# Patient Record
Sex: Male | Born: 1965 | Race: Black or African American | Hispanic: No | Marital: Single | Smoking: Former smoker
Health system: Southern US, Community
[De-identification: ages and names within clinical notes are randomized; demographics above are authoritative.]

## PROBLEM LIST (undated history)

## (undated) DIAGNOSIS — E1142 Type 2 diabetes mellitus with diabetic polyneuropathy: Secondary | ICD-10-CM

## (undated) DIAGNOSIS — F1010 Alcohol abuse, uncomplicated: Secondary | ICD-10-CM

## (undated) DIAGNOSIS — I10 Essential (primary) hypertension: Secondary | ICD-10-CM

## (undated) DIAGNOSIS — E119 Type 2 diabetes mellitus without complications: Secondary | ICD-10-CM

## (undated) DIAGNOSIS — E669 Obesity, unspecified: Secondary | ICD-10-CM

## (undated) DIAGNOSIS — G4700 Insomnia, unspecified: Secondary | ICD-10-CM

## (undated) DIAGNOSIS — F32A Depression, unspecified: Secondary | ICD-10-CM

## (undated) DIAGNOSIS — D649 Anemia, unspecified: Secondary | ICD-10-CM

## (undated) DIAGNOSIS — R42 Dizziness and giddiness: Secondary | ICD-10-CM

## (undated) DIAGNOSIS — E559 Vitamin D deficiency, unspecified: Secondary | ICD-10-CM

## (undated) DIAGNOSIS — Z87891 Personal history of nicotine dependence: Secondary | ICD-10-CM

## (undated) DIAGNOSIS — E785 Hyperlipidemia, unspecified: Secondary | ICD-10-CM

## (undated) DIAGNOSIS — R7303 Prediabetes: Secondary | ICD-10-CM

## (undated) DIAGNOSIS — G4733 Obstructive sleep apnea (adult) (pediatric): Secondary | ICD-10-CM

## (undated) DIAGNOSIS — F419 Anxiety disorder, unspecified: Secondary | ICD-10-CM

## (undated) DIAGNOSIS — R079 Chest pain, unspecified: Secondary | ICD-10-CM

## (undated) HISTORY — DX: Vitamin D deficiency, unspecified: E55.9

## (undated) HISTORY — DX: Personal history of nicotine dependence: Z87.891

## (undated) HISTORY — DX: Depression, unspecified: F32.A

## (undated) HISTORY — DX: Hyperlipidemia, unspecified: E78.5

## (undated) HISTORY — DX: Chest pain, unspecified: R07.9

## (undated) HISTORY — DX: Type 2 diabetes mellitus with diabetic polyneuropathy: E11.42

## (undated) HISTORY — DX: Anemia, unspecified: D64.9

## (undated) HISTORY — DX: Prediabetes: R73.03

## (undated) HISTORY — DX: Type 2 diabetes mellitus without complications: E11.9

## (undated) HISTORY — PX: NO PAST SURGERIES: SHX2092

## (undated) HISTORY — DX: Obstructive sleep apnea (adult) (pediatric): G47.33

---

## 2005-10-03 ENCOUNTER — Ambulatory Visit: Payer: Self-pay | Admitting: Oncology

## 2006-10-17 ENCOUNTER — Emergency Department (HOSPITAL_COMMUNITY): Admission: EM | Admit: 2006-10-17 | Discharge: 2006-10-17 | Payer: Self-pay | Admitting: Emergency Medicine

## 2010-02-24 ENCOUNTER — Emergency Department (HOSPITAL_COMMUNITY)
Admission: EM | Admit: 2010-02-24 | Discharge: 2010-02-24 | Payer: Self-pay | Source: Home / Self Care | Admitting: Emergency Medicine

## 2010-02-27 LAB — OCCULT BLOOD, POC DEVICE: Fecal Occult Bld: NEGATIVE

## 2011-12-26 ENCOUNTER — Ambulatory Visit: Payer: Self-pay | Admitting: Family Medicine

## 2011-12-26 VITALS — BP 134/84 | HR 87 | Temp 98.4°F | Resp 18 | Ht 73.0 in | Wt 340.6 lb

## 2011-12-26 DIAGNOSIS — M436 Torticollis: Secondary | ICD-10-CM

## 2011-12-26 DIAGNOSIS — G243 Spasmodic torticollis: Secondary | ICD-10-CM

## 2011-12-26 NOTE — Progress Notes (Signed)
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 He woke up yesterday morning with stiff neck and it's gotten worse. He has no pain in his arm no numbness in his arm but the pain is off to the right side of his occipital area and proximal (superior cervical spine). He's never had this problem before.  Objective: Patient sitting calmly in the chair but refusing to move his neck. He has full arm range of motion and sensation is normal as well. Reflexes are normal. He has no pain when flexing or extending his neck but does have pain rotating either right or left greater than 10.  Assessment: Torticollis  Plan: Prednisone 20 mg 2 daily for 5 days and Flexeril 10 mg 3 times a day. He was cautioned about sedation with Flexeril although he has taken before without experiencing the sedation.  Patient was given note for work for today and tomorrow

## 2012-01-01 ENCOUNTER — Emergency Department (INDEPENDENT_AMBULATORY_CARE_PROVIDER_SITE_OTHER)
Admission: EM | Admit: 2012-01-01 | Discharge: 2012-01-01 | Disposition: A | Discharge status: Home / Self Care | Payer: Worker's Compensation | Source: Home / Self Care | Attending: Family Medicine | Admitting: Family Medicine

## 2012-01-01 ENCOUNTER — Encounter (HOSPITAL_COMMUNITY): Payer: Self-pay | Admitting: *Deleted

## 2012-01-01 DIAGNOSIS — S0086XA Insect bite (nonvenomous) of other part of head, initial encounter: Secondary | ICD-10-CM

## 2012-01-01 DIAGNOSIS — S1096XA Insect bite of unspecified part of neck, initial encounter: Secondary | ICD-10-CM

## 2012-01-01 MED ORDER — MUPIROCIN CALCIUM 2 % EX CREA: TOPICAL_CREAM | Freq: Three times a day (TID) | CUTANEOUS | Status: DC

## 2012-01-01 MED ORDER — DOXYCYCLINE HYCLATE 100 MG PO CAPS: 100.0000 mg | ORAL_CAPSULE | Freq: Two times a day (BID) | ORAL | Status: DC

## 2012-01-01 NOTE — ED Notes (Signed)
Pt noticed a spider on his face yesterday.  Today he noticed redness, swelling and pain on his chin

## 2012-01-01 NOTE — ED Provider Notes (Signed)
History     CSN: [id number]  Arrival date & time 01/01/12  1959   First MD Initiated Contact with Patient 01/01/12 2012      Chief Complaint  Patient presents with  . Insect Bite    (Consider location/radiation/quality/duration/timing/severity/associated sxs/prior treatment) Patient is a 46 y.o. male presenting with rash. The history is provided by the patient.  Rash  This is a new problem. The current episode started yesterday. The problem has not changed since onset.The problem is associated with an insect bite/sting (swatted insect off of chin last eve after cobweb contact near dumpster last eve on job.). There has been no fever. The rash is present on the face. The pain is mild. Associated symptoms include blisters.    History reviewed. No pertinent past medical history.  History reviewed. No pertinent past surgical history.  History reviewed. No pertinent family history.  History  Substance Use Topics  . Smoking status: Current Every Day Smoker -- 1.0 packs/day    Types: Cigarettes  . Smokeless tobacco: Not on file  . Alcohol Use: Yes      Review of Systems  Constitutional: Negative.   Skin: Positive for rash and wound.    Allergies  Review of patient's allergies indicates no known allergies.  Home Medications   Current Outpatient Rx  Name  Route  Sig  Dispense  Refill  . IBUPROFEN 200 MG PO TABS   Oral   Take 200 mg by mouth every 6 (six) hours as needed.         Marland Kitchen DOXYCYCLINE HYCLATE 100 MG PO CAPS   Oral   Take 1 capsule (100 mg total) by mouth 2 (two) times daily.   20 capsule   0   . MUPIROCIN CALCIUM 2 % EX CREA   Topical   Apply topically 3 (three) times daily.   30 g   0     BP 120/82  Pulse 100  Temp 98.3 F (36.8 C) (Oral)  Resp 20  SpO2 97%  Physical Exam  Nursing note and vitals reviewed. Constitutional: He is oriented to person, place, and time. He appears well-developed and well-nourished.  Neck: Normal range of motion.  Neck supple.  Lymphadenopathy:    He has cervical adenopathy.  Neurological: He is alert and oriented to person, place, and time.  Skin: Skin is warm and dry. Rash noted. There is erythema.       Local sts with open blister centrally on chin with submandibular node, tender.    ED Course  Procedures (including critical care time)  Labs Reviewed - No data to display No results found.   1. Insect bite of face       MDM          Linna Hoff, MD 01/01/12 2130

## 2013-10-24 ENCOUNTER — Emergency Department (HOSPITAL_COMMUNITY): Payer: BC Managed Care – PPO

## 2013-10-24 ENCOUNTER — Encounter (HOSPITAL_COMMUNITY): Payer: Self-pay | Admitting: Emergency Medicine

## 2013-10-24 ENCOUNTER — Emergency Department (HOSPITAL_COMMUNITY)
Admission: EM | Admit: 2013-10-24 | Discharge: 2013-10-24 | Disposition: A | Discharge status: Home / Self Care | Payer: BC Managed Care – PPO | Attending: Emergency Medicine | Admitting: Emergency Medicine

## 2013-10-24 DIAGNOSIS — F17200 Nicotine dependence, unspecified, uncomplicated: Secondary | ICD-10-CM | POA: Diagnosis not present

## 2013-10-24 DIAGNOSIS — J029 Acute pharyngitis, unspecified: Secondary | ICD-10-CM | POA: Diagnosis present

## 2013-10-24 DIAGNOSIS — J039 Acute tonsillitis, unspecified: Secondary | ICD-10-CM | POA: Insufficient documentation

## 2013-10-24 LAB — CBC WITH DIFFERENTIAL/PLATELET
Basophils Absolute: 0 10*3/uL (ref 0.0–0.1)
Basophils Relative: 0 % (ref 0–1)
Eosinophils Absolute: 0.5 10*3/uL (ref 0.0–0.7)
Eosinophils Relative: 3 % (ref 0–5)
HCT: 38.6 % — ABNORMAL LOW (ref 39.0–52.0)
Hemoglobin: 13.1 g/dL (ref 13.0–17.0)
Lymphocytes Relative: 10 % — ABNORMAL LOW (ref 12–46)
Lymphs Abs: 2 10*3/uL (ref 0.7–4.0)
MCH: 31.9 pg (ref 26.0–34.0)
MCHC: 33.9 g/dL (ref 30.0–36.0)
MCV: 93.9 fL (ref 78.0–100.0)
Monocytes Absolute: 1.9 10*3/uL — ABNORMAL HIGH (ref 0.1–1.0)
Monocytes Relative: 10 % (ref 3–12)
Neutro Abs: 15.1 10*3/uL — ABNORMAL HIGH (ref 1.7–7.7)
Neutrophils Relative %: 77 % (ref 43–77)
Platelets: 283 10*3/uL (ref 150–400)
RBC: 4.11 MIL/uL — ABNORMAL LOW (ref 4.22–5.81)
RDW: 15.3 % (ref 11.5–15.5)
WBC: 19.5 10*3/uL — ABNORMAL HIGH (ref 4.0–10.5)

## 2013-10-24 LAB — BASIC METABOLIC PANEL
Anion gap: 12 (ref 5–15)
BUN: 11 mg/dL (ref 6–23)
CO2: 24 mEq/L (ref 19–32)
Calcium: 9 mg/dL (ref 8.4–10.5)
Chloride: 106 mEq/L (ref 96–112)
Creatinine, Ser: 0.94 mg/dL (ref 0.50–1.35)
GFR calc Af Amer: >90 mL/min (ref >90)
GFR calc non Af Amer: >90 mL/min (ref >90)
Glucose, Bld: 106 mg/dL — ABNORMAL HIGH (ref 70–99)
Potassium: 4 mEq/L (ref 3.7–5.3)
Sodium: 142 mEq/L (ref 137–147)

## 2013-10-24 LAB — RAPID STREP SCREEN (MED CTR MEBANE ONLY): Streptococcus, Group A Screen (Direct): NEGATIVE

## 2013-10-24 MED ORDER — METHYLPREDNISOLONE SODIUM SUCC 125 MG IJ SOLR
125.0000 mg | Freq: Once | INTRAMUSCULAR | Status: AC
  Administered 2013-10-24: 125 mg via INTRAVENOUS
  Filled 2013-10-24: qty 2

## 2013-10-24 MED ORDER — PREDNISONE 10 MG PO TABS: 20.0000 mg | ORAL_TABLET | Freq: Every day | ORAL | Status: DC

## 2013-10-24 MED ORDER — OXYCODONE-ACETAMINOPHEN 5-325 MG PO TABS: 1.0000 | ORAL_TABLET | ORAL | Status: DC | PRN

## 2013-10-24 MED ORDER — MORPHINE SULFATE 4 MG/ML IJ SOLN
4.0000 mg | Freq: Once | INTRAMUSCULAR | Status: AC
  Administered 2013-10-24: 4 mg via INTRAVENOUS
  Filled 2013-10-24: qty 1

## 2013-10-24 MED ORDER — AMOXICILLIN 500 MG PO CAPS: 1000.0000 mg | ORAL_CAPSULE | Freq: Two times a day (BID) | ORAL | Status: DC

## 2013-10-24 MED ORDER — SODIUM CHLORIDE 0.9 % IV SOLN: 1000.0000 mL | INTRAVENOUS | Status: DC

## 2013-10-24 MED ORDER — DEXTROSE 5 % IV SOLN
1.0000 g | Freq: Once | INTRAVENOUS | Status: AC
  Administered 2013-10-24: 1 g via INTRAVENOUS
  Filled 2013-10-24: qty 10

## 2013-10-24 MED ORDER — IOHEXOL 300 MG/ML  SOLN
75.0000 mL | Freq: Once | INTRAMUSCULAR | Status: AC | PRN
  Administered 2013-10-24: 75 mL via INTRAVENOUS

## 2013-10-24 MED ORDER — SODIUM CHLORIDE 0.9 % IV SOLN
1000.0000 mL | Freq: Once | INTRAVENOUS | Status: AC
  Administered 2013-10-24: 1000 mL via INTRAVENOUS

## 2013-10-24 NOTE — ED Notes (Signed)
Patient here with complaint of sore throat, cough, nasal congestion, and mild headache. States that symptoms began 2-3 days ago and have gotten worse. Explains that at this time it is difficult for him to swallow, and it is so uncomfortable he has not been able to sleep. States last sleep was over 24 hours ago.

## 2013-10-24 NOTE — ED Provider Notes (Signed)
CSN: [id number]     Arrival date & time 10/24/13  0450 History   First MD Initiated Contact with Patient 10/24/13 0540     Chief Complaint  Patient presents with  . Nasal Congestion  . Sore Throat  . Cough     (Consider location/radiation/quality/duration/timing/severity/associated sxs/prior Treatment) Patient is a 48 y.o. male presenting with pharyngitis and cough. The history is provided by the patient.  Sore Throat  Cough He started having a sore throat yesterday and has been getting progressively worse. Since last night, has been unable to swallow it is noted that his voice is hoarse. He had subjective fever at home. He had some chills but no sweats. He rates his pain at 8/10. Nothing makes it better. He denies chest discomfort, nausea, vomiting, myalgias or arthralgias. He denies any sick contacts.  History reviewed. No pertinent past medical history. History reviewed. No pertinent past surgical history. History reviewed. No pertinent family history. History  Substance Use Topics  . Smoking status: Current Every Day Smoker -- 0.50 packs/day    Types: Cigarettes  . Smokeless tobacco: Not on file  . Alcohol Use: Yes    Review of Systems  Respiratory: Positive for cough.   All other systems reviewed and are negative.     Allergies  Review of patient's allergies indicates no known allergies.  Home Medications   Prior to Admission medications   Medication Sig Start Date End Date Taking? Authorizing Provider  ibuprofen (ADVIL,MOTRIN) 200 MG tablet Take 800 mg by mouth every 8 (eight) hours as needed for moderate pain.   Yes Historical Provider, MD   BP 143/80  Pulse 110  Temp(Src) 98.8 F (37.1 C) (Oral)  Resp 20  Ht 6\' 2"  (1.88 m)  Wt 340 lb (154.223 kg)  BMI 43.63 kg/m2  SpO2 97% Physical Exam  Nursing note and vitals reviewed.  Morbidly obese 48 year old male, resting comfortably and in no acute distress. Vital signs are significant for tachycardia and mild  hypertension. Oxygen saturation is 97%, which is normal. Head is normocephalic and atraumatic. PERRLA, EOMI. Oropharynx shows generalized erythema and swelling. Uvula does move in the midline and there is no definite abscess formation. He he is tolerating his secretions but his voice is definitely hoarse. Neck is nontender and supple without adenopathy or JVD. Back is nontender and there is no CVA tenderness. Lungs are clear without rales, wheezes, or rhonchi. Chest is nontender. Heart has regular rate and rhythm without murmur. Abdomen is soft, flat, nontender without masses or hepatosplenomegaly and peristalsis is normoactive. Extremities have no cyanosis or edema, full range of motion is present. Skin is warm and dry without rash. Neurologic: Mental status is normal, cranial nerves are intact, there are no motor or sensory deficits.  ED Course  Procedures (including critical care time) Labs Review Results for orders placed during the hospital encounter of 10/24/13  RAPID STREP SCREEN      Result Value Ref Range   Streptococcus, Group A Screen (Direct) NEGATIVE  NEGATIVE  CBC WITH DIFFERENTIAL      Result Value Ref Range   WBC 19.5 (*) 4.0 - 10.5 K/uL   RBC 4.11 (*) 4.22 - 5.81 MIL/uL   Hemoglobin 13.1  13.0 - 17.0 g/dL   HCT 38.6 (*) 39.0 - 52.0 %   MCV 93.9  78.0 - 100.0 fL   MCH 31.9  26.0 - 34.0 pg   MCHC 33.9  30.0 - 36.0 g/dL   RDW 15.3  11.5 -  15.5 %   Platelets 283  150 - 400 K/uL   Neutrophils Relative % 77  43 - 77 %   Neutro Abs 15.1 (*) 1.7 - 7.7 K/uL   Lymphocytes Relative 10 (*) 12 - 46 %   Lymphs Abs 2.0  0.7 - 4.0 K/uL   Monocytes Relative 10  3 - 12 %   Monocytes Absolute 1.9 (*) 0.1 - 1.0 K/uL   Eosinophils Relative 3  0 - 5 %   Eosinophils Absolute 0.5  0.0 - 0.7 K/uL   Basophils Relative 0  0 - 1 %   Basophils Absolute 0.0  0.0 - 0.1 K/uL  BASIC METABOLIC PANEL      Result Value Ref Range   Sodium 142  137 - 147 mEq/L   Potassium 4.0  3.7 - 5.3 mEq/L    Chloride 106  96 - 112 mEq/L   CO2 24  19 - 32 mEq/L   Glucose, Bld 106 (*) 70 - 99 mg/dL   BUN 11  6 - 23 mg/dL   Creatinine, Ser 0.94  0.50 - 1.35 mg/dL   Calcium 9.0  8.4 - 10.5 mg/dL   GFR calc non Af Amer >90  >90 mL/min   GFR calc Af Amer >90  >90 mL/min   Anion gap 12  5 - 15   Imaging Review Ct Soft Tissue Neck W Contrast  10/24/2013   CLINICAL DATA:  Sore throat, difficulty swallowing, and hoarse voice. 48 year old male. Elevated white blood count. Patient is a smoker.  EXAM: CT NECK WITH CONTRAST  TECHNIQUE: Multidetector CT imaging of the neck was performed using the standard protocol following the bolus administration of intravenous contrast.  CONTRAST:  49mL OMNIPAQUE IOHEXOL 300 MG/ML  SOLN  COMPARISON:  None.  FINDINGS: There is symmetric bilateral prominence of the palatine tonsils. No evidence of tonsillar abscess. There is prominence of the adenoids.  No prevertebral edema or retropharyngeal abscess. Epiglottis is within normal limits.  Incidentally imaged portion of the brain shows no evidence of hydrocephalus or midline shift.  Prominent lymph nodes are seen bilaterally. 2.0 x 1.5 cm right submandibular lymph node. 2.4 x 1.4 cm left submandibular lymph node. Additional smaller reactive size lymph nodes. Enlarged left supraclavicular lymph node measures 1.6 x 1.4 cm.  Enlargement of the left lobe of the thyroid gland with substernal extension. No nodules are seen within the thyroid gland. The left thyroid lobe enlargement causes rightward deviation of the trachea.  Lung apices are clear.  Well defined sclerotic lesion with central lucency in the right clavicle near the clavicular head has benign appearances. Similar but smaller sclerotic lesion with central lucency in the inferior aspect of the left medial clavicle.  Imaged portions of the paranasal sinuses are clear. The mastoid air cells and middle ears are clear. The temporomandibular joints are anteriorly subluxed bilaterally.   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 No acute bony abnormality of the cervical spine identified.  IMPRESSION: 1. Enlargement of the palatine tonsils and adenoid tissue with regional lymphadenopathy in the neck and prominent left supraclavicular lymph node. Considerations include acute tonsillitis/pharyngitis with reactive lymphadenopathy. In an adult patient, the possibility of HIV should be considered. Lymphoma cannot be completely excluded. Clinical followup is necessary. Consideration could be given to repeat neck CT in 2-3 months. 2. The temporomandibular joints are anteriorly subluxed bilaterally. 3. Enlargement of the left lobe of the thyroid gland with substernal extension. This causes deviation of  the trachea to the right.   Electronically Signed   By: Curlene Dolphin M.D.   On: 10/24/2013 07:44    Images viewed by me.  MDM   Final diagnoses:  Tonsillitis    Sore throat with hoarseness and difficulty swallowing. Because of a large tongue and has difficulty opening his mouth, I. cannot get a really good look at his food. He will be sent for CT to evaluate possible peritonsillar abscess. In the meantime, given IV fluids and methylprednisolone.  Following the above noted treatment, patient still complained of pain but was speaking much clearer. She has venous come back negative the WBC is significantly elevated with borderline left shift. CT shows generalized enlargement of tonsils and adenoids and a lymphadenopathy. Because of elevated WBC and degree of swelling thumb is elected to start broad-spectrum antibiotics in spite of negative strep screen. He is given an initial dose of ceftriaxone and is discharged with prescription for amoxicillin. He is also given prescriptions for prednisone and oxycodone and acetaminophen. He is to return should symptoms worsen.  Delora Fuel, MD 46/65/99 [id number]

## 2013-10-24 NOTE — ED Notes (Signed)
Pt reports headache, cough, congestion and sore throat x 2-3 days. Pt states he has been taking cough drops, ibuprofen and eating soup with no relief. Pt states he has not been able to sleep in 24hrs.

## 2013-10-24 NOTE — Discharge Instructions (Signed)
Return if any problems. Drink plenty of fluids.   Pharyngitis Pharyngitis is redness, pain, and swelling (inflammation) of your pharynx.  CAUSES  Pharyngitis is usually caused by infection. Most of the time, these infections are from viruses (viral) and are part of a cold. However, sometimes pharyngitis is caused by bacteria (bacterial). Pharyngitis can also be caused by allergies. Viral pharyngitis may be spread from person to person by coughing, sneezing, and personal items or utensils (cups, forks, spoons, toothbrushes). Bacterial pharyngitis may be spread from person to person by more intimate contact, such as kissing.  SIGNS AND SYMPTOMS  Symptoms of pharyngitis include:   Sore throat.   Tiredness (fatigue).   Low-grade fever.   Headache.  Joint pain and muscle aches.  Skin rashes.  Swollen lymph nodes.  Plaque-like film on throat or tonsils (often seen with bacterial pharyngitis). DIAGNOSIS  Your health care provider will ask you questions about your illness and your symptoms. Your medical history, along with a physical exam, is often all that is needed to diagnose pharyngitis. Sometimes, a rapid strep test is done. Other lab tests may also be done, depending on the suspected cause.  TREATMENT  Viral pharyngitis will usually get better in 3-4 days without the use of medicine. Bacterial pharyngitis is treated with medicines that kill germs (antibiotics).  HOME CARE INSTRUCTIONS   Drink enough water and fluids to keep your urine clear or pale yellow.   Only take over-the-counter or prescription medicines as directed by your health care provider:   If you are prescribed antibiotics, make sure you finish them even if you start to feel better.   Do not take aspirin.   Get lots of rest.   Gargle with 8 oz of salt water ( tsp of salt per 1 qt of water) as often as every 1-2 hours to soothe your throat.   Throat lozenges (if you are not at risk for choking) or  sprays may be used to soothe your throat. SEEK MEDICAL CARE IF:   You have large, tender lumps in your neck.  You have a rash.  You cough up green, yellow-brown, or bloody spit. SEEK IMMEDIATE MEDICAL CARE IF:   Your neck becomes stiff.  You drool or are unable to swallow liquids.  You vomit or are unable to keep medicines or liquids down.  You have severe pain that does not go away with the use of recommended medicines.  You have trouble breathing (not caused by a stuffy nose). MAKE SURE YOU:   Understand these instructions.  Will watch your condition.  Will get help right away if you are not doing well or get worse. Document Released: 01/29/2005 Document Revised: 11/19/2012 Document Reviewed: 10/06/2012 [facility] Patient Information 2015 [place], [place]. This information is not intended to replace advice given to you by your health care provider. Make sure you discuss any questions you have with your health care provider.  Amoxicillin capsules or tablets What is this medicine? AMOXICILLIN (a mox i SIL in) is a penicillin antibiotic. It is used to treat certain kinds of bacterial infections. It will not work for colds, flu, or other viral infections. This medicine may be used for other purposes; ask your health care provider or pharmacist if you have questions. COMMON BRAND NAME(S): Amoxil, Moxilin, Sumox, Trimox What should I tell my health care provider before I take this medicine? They need to know if you have any of these conditions: -asthma -kidney disease -an unusual or allergic reaction to amoxicillin,  other penicillins, cephalosporin antibiotics, other medicines, foods, dyes, or preservatives -pregnant or trying to get pregnant -breast-feeding How should I use this medicine? Take this medicine by mouth with a glass of water. Follow the directions on your prescription label. You may take this medicine with food or on an empty stomach. Take your medicine at regular  intervals. Do not take your medicine more often than directed. Take all of your medicine as directed even if you think your are better. Do not skip doses or stop your medicine early. Talk to your pediatrician regarding the use of this medicine in children. While this drug may be prescribed for selected conditions, precautions do apply. Overdosage: If you think you have taken too much of this medicine contact a poison control center or emergency room at once. NOTE: This medicine is only for you. Do not share this medicine with others. What if I miss a dose? If you miss a dose, take it as soon as you can. If it is almost time for your next dose, take only that dose. Do not take double or extra doses. What may interact with this medicine? -amiloride -birth control pills -chloramphenicol -macrolides -probenecid -sulfonamides -tetracyclines This list may not describe all possible interactions. Give your health care provider a list of all the medicines, herbs, non-prescription drugs, or dietary supplements you use. Also tell them if you smoke, drink alcohol, or use illegal drugs. Some items may interact with your medicine. What should I watch for while using this medicine? Tell your doctor or health care professional if your symptoms do not improve in 2 or 3 days. Take all of the doses of your medicine as directed. Do not skip doses or stop your medicine early. If you are diabetic, you may get a false positive result for sugar in your urine with certain brands of urine tests. Check with your doctor. Do not treat diarrhea with over-the-counter products. Contact your doctor if you have diarrhea that lasts more than 2 days or if the diarrhea is severe and watery. What side effects may I notice from receiving this medicine? Side effects that you should report to your doctor or health care professional as soon as possible: -allergic reactions like skin rash, itching or hives, swelling of the face, lips, or  tongue -breathing problems -dark urine -redness, blistering, peeling or loosening of the skin, including inside the mouth -seizures -severe or watery diarrhea -trouble passing urine or change in the amount of urine -unusual bleeding or bruising -unusually weak or tired -yellowing of the eyes or skin Side effects that usually do not require medical attention (report to your doctor or health care professional if they continue or are bothersome): -dizziness -headache -stomach upset -trouble sleeping This list may not describe all possible side effects. Call your doctor for medical advice about side effects. You may report side effects to FDA at 1-800-FDA-1088. Where should I keep my medicine? Keep out of the reach of children. Store between 68 and 77 degrees F (20 and 25 degrees C). Keep bottle closed tightly. Throw away any unused medicine after the expiration date. NOTE: This sheet is a summary. It may not cover all possible information. If you have questions about this medicine, talk to your doctor, pharmacist, or health care provider.  2015, [employer] Standard. (2007-04-22 14:10:59)  Prednisone tablets What is this medicine? PREDNISONE (PRED ni sone) is a corticosteroid. It is commonly used to treat inflammation of the skin, joints, lungs, and other organs. Common conditions treated include  asthma, allergies, and arthritis. It is also used for other conditions, such as blood disorders and diseases of the adrenal glands. This medicine may be used for other purposes; ask your health care provider or pharmacist if you have questions. COMMON BRAND NAME(S): Deltasone, Predone, Sterapred, Sterapred DS What should I tell my health care provider before I take this medicine? They need to know if you have any of these conditions: -Cushing's syndrome -diabetes -glaucoma -heart disease -high blood pressure -infection (especially a virus infection such as chickenpox, cold sores, or  herpes) -kidney disease -liver disease -mental illness -myasthenia gravis -osteoporosis -seizures -stomach or intestine problems -thyroid disease -an unusual or allergic reaction to lactose, prednisone, other medicines, foods, dyes, or preservatives -pregnant or trying to get pregnant -breast-feeding How should I use this medicine? Take this medicine by mouth with a glass of water. Follow the directions on the prescription label. Take this medicine with food. If you are taking this medicine once a day, take it in the morning. Do not take more medicine than you are told to take. Do not suddenly stop taking your medicine because you may develop a severe reaction. Your doctor will tell you how much medicine to take. If your doctor wants you to stop the medicine, the dose may be slowly lowered over time to avoid any side effects. Talk to your pediatrician regarding the use of this medicine in children. Special care may be needed. Overdosage: If you think you have taken too much of this medicine contact a poison control center or emergency room at once. NOTE: This medicine is only for you. Do not share this medicine with others. What if I miss a dose? If you miss a dose, take it as soon as you can. If it is almost time for your next dose, talk to your doctor or health care professional. You may need to miss a dose or take an extra dose. Do not take double or extra doses without advice. What may interact with this medicine? Do not take this medicine with any of the following medications: -metyrapone -mifepristone This medicine may also interact with the following medications: -aminoglutethimide -amphotericin B -aspirin and aspirin-like medicines -barbiturates -certain medicines for diabetes, like glipizide or glyburide -cholestyramine -cholinesterase inhibitors -cyclosporine -digoxin -diuretics -ephedrine -male hormones, like estrogens and birth control  pills -isoniazid -ketoconazole -NSAIDS, medicines for pain and inflammation, like ibuprofen or naproxen -phenytoin -rifampin -toxoids -vaccines -warfarin This list may not describe all possible interactions. Give your health care provider a list of all the medicines, herbs, non-prescription drugs, or dietary supplements you use. Also tell them if you smoke, drink alcohol, or use illegal drugs. Some items may interact with your medicine. What should I watch for while using this medicine? Visit your doctor or health care professional for regular checks on your progress. If you are taking this medicine over a prolonged period, carry an identification card with your name and address, the type and dose of your medicine, and your doctor's name and address. This medicine may increase your risk of getting an infection. Tell your doctor or health care professional if you are around anyone with measles or chickenpox, or if you develop sores or blisters that do not heal properly. If you are going to have surgery, tell your doctor or health care professional that you have taken this medicine within the last twelve months. Ask your doctor or health care professional about your diet. You may need to lower the amount of salt you eat.  This medicine may affect blood sugar levels. If you have diabetes, check with your doctor or health care professional before you change your diet or the dose of your diabetic medicine. What side effects may I notice from receiving this medicine? Side effects that you should report to your doctor or health care professional as soon as possible: -allergic reactions like skin rash, itching or hives, swelling of the face, lips, or tongue -changes in emotions or moods -changes in vision -depressed mood -eye pain -fever or chills, cough, sore throat, pain or difficulty passing urine -increased thirst -swelling of ankles, feet Side effects that usually do not require medical  attention (report to your doctor or health care professional if they continue or are bothersome): -confusion, excitement, restlessness -headache -nausea, vomiting -skin problems, acne, thin and shiny skin -trouble sleeping -weight gain This list may not describe all possible side effects. Call your doctor for medical advice about side effects. You may report side effects to FDA at 1-800-FDA-1088. Where should I keep my medicine? Keep out of the reach of children. Store at room temperature between 15 and 30 degrees C (59 and 86 degrees F). Protect from light. Keep container tightly closed. Throw away any unused medicine after the expiration date. NOTE: This sheet is a summary. It may not cover all possible information. If you have questions about this medicine, talk to your doctor, pharmacist, or health care provider.  2015, [employer] Standard. (2010-09-14 10:57:14)  Acetaminophen; Oxycodone tablets What is this medicine? ACETAMINOPHEN; OXYCODONE (a set a MEE noe fen; ox i KOE done) is a pain reliever. It is used to treat mild to moderate pain. This medicine may be used for other purposes; ask your health care provider or pharmacist if you have questions. COMMON BRAND NAME(S): Endocet, Magnacet, Narvox, Percocet, Perloxx, Primalev, Primlev, Roxicet, Xolox What should I tell my health care provider before I take this medicine? They need to know if you have any of these conditions: -brain tumor -Crohn's disease, inflammatory bowel disease, or ulcerative colitis -drug abuse or addiction -head injury -heart or circulation problems -if you often drink alcohol -kidney disease or problems going to the bathroom -liver disease -lung disease, asthma, or breathing problems -an unusual or allergic reaction to acetaminophen, oxycodone, other opioid analgesics, other medicines, foods, dyes, or preservatives -pregnant or trying to get pregnant -breast-feeding How should I use this  medicine? Take this medicine by mouth with a full glass of water. Follow the directions on the prescription label. Take your medicine at regular intervals. Do not take your medicine more often than directed. Talk to your pediatrician regarding the use of this medicine in children. Special care may be needed. Patients over 52 years old may have a stronger reaction and need a smaller dose. Overdosage: If you think you have taken too much of this medicine contact a poison control center or emergency room at once. NOTE: This medicine is only for you. Do not share this medicine with others. What if I miss a dose? If you miss a dose, take it as soon as you can. If it is almost time for your next dose, take only that dose. Do not take double or extra doses. What may interact with this medicine? -alcohol -antihistamines -barbiturates like amobarbital, butalbital, butabarbital, methohexital, pentobarbital, phenobarbital, thiopental, and secobarbital -benztropine -drugs for bladder problems like solifenacin, trospium, oxybutynin, tolterodine, hyoscyamine, and methscopolamine -drugs for breathing problems like ipratropium and tiotropium -drugs for certain stomach or intestine problems like propantheline, homatropine methylbromide, glycopyrrolate,  atropine, belladonna, and dicyclomine -general anesthetics like etomidate, ketamine, nitrous oxide, propofol, desflurane, enflurane, halothane, isoflurane, and sevoflurane -medicines for depression, anxiety, or psychotic disturbances -medicines for sleep -muscle relaxants -naltrexone -narcotic medicines (opiates) for pain -phenothiazines like perphenazine, thioridazine, chlorpromazine, mesoridazine, fluphenazine, prochlorperazine, promazine, and trifluoperazine -scopolamine -tramadol -trihexyphenidyl This list may not describe all possible interactions. Give your health care provider a list of all the medicines, herbs, non-prescription drugs, or dietary  supplements you use. Also tell them if you smoke, drink alcohol, or use illegal drugs. Some items may interact with your medicine. What should I watch for while using this medicine? Tell your doctor or health care professional if your pain does not go away, if it gets worse, or if you have new or a different type of pain. You may develop tolerance to the medicine. Tolerance means that you will need a higher dose of the medication for pain relief. Tolerance is normal and is expected if you take this medicine for a long time. Do not suddenly stop taking your medicine because you may develop a severe reaction. Your body becomes used to the medicine. This does NOT mean you are addicted. Addiction is a behavior related to getting and using a drug for a non-medical reason. If you have pain, you have a medical reason to take pain medicine. Your doctor will tell you how much medicine to take. If your doctor wants you to stop the medicine, the dose will be slowly lowered over time to avoid any side effects. You may get drowsy or dizzy. Do not drive, use machinery, or do anything that needs mental alertness until you know how this medicine affects you. Do not stand or sit up quickly, especially if you are an older patient. This reduces the risk of dizzy or fainting spells. Alcohol may interfere with the effect of this medicine. Avoid alcoholic drinks. There are different types of narcotic medicines (opiates) for pain. If you take more than one type at the same time, you may have more side effects. Give your health care provider a list of all medicines you use. Your doctor will tell you how much medicine to take. Do not take more medicine than directed. Call emergency for help if you have problems breathing. The medicine will cause constipation. Try to have a bowel movement at least every 2 to 3 days. If you do not have a bowel movement for 3 days, call your doctor or health care professional. Do not take Tylenol  (acetaminophen) or medicines that have acetaminophen with this medicine. Too much acetaminophen can be very dangerous. Many nonprescription medicines contain acetaminophen. Always read the labels carefully to avoid taking more acetaminophen. What side effects may I notice from receiving this medicine? Side effects that you should report to your doctor or health care professional as soon as possible: -allergic reactions like skin rash, itching or hives, swelling of the face, lips, or tongue -breathing difficulties, wheezing -confusion -light headedness or fainting spells -severe stomach pain -unusually weak or tired -yellowing of the skin or the whites of the eyes Side effects that usually do not require medical attention (report to your doctor or health care professional if they continue or are bothersome): -dizziness -drowsiness -nausea -vomiting This list may not describe all possible side effects. Call your doctor for medical advice about side effects. You may report side effects to FDA at 1-800-FDA-1088. Where should I keep my medicine? Keep out of the reach of children. This medicine can be abused. Keep your  medicine in a safe place to protect it from theft. Do not share this medicine with anyone. Selling or giving away this medicine is dangerous and against the law. Store at room temperature between 20 and 25 degrees C (68 and 77 degrees F). Keep container tightly closed. Protect from light. This medicine may cause accidental overdose and death if it is taken by other adults, children, or pets. Flush any unused medicine down the toilet to reduce the chance of harm. Do not use the medicine after the expiration date. NOTE: This sheet is a summary. It may not cover all possible information. If you have questions about this medicine, talk to your doctor, pharmacist, or health care provider.  2015, [employer] Standard. (2012-09-22 13:17:35)

## 2013-10-24 NOTE — ED Notes (Signed)
Pt reports productive cough with clear to yellow sputum.

## 2013-10-25 ENCOUNTER — Emergency Department (HOSPITAL_COMMUNITY)
Admission: EM | Admit: 2013-10-25 | Discharge: 2013-10-25 | Disposition: A | Discharge status: Home / Self Care | Payer: BC Managed Care – PPO | Attending: Emergency Medicine | Admitting: Emergency Medicine

## 2013-10-25 ENCOUNTER — Encounter (HOSPITAL_COMMUNITY): Payer: Self-pay | Admitting: Emergency Medicine

## 2013-10-25 DIAGNOSIS — IMO0002 Reserved for concepts with insufficient information to code with codable children: Secondary | ICD-10-CM | POA: Diagnosis not present

## 2013-10-25 DIAGNOSIS — F17200 Nicotine dependence, unspecified, uncomplicated: Secondary | ICD-10-CM | POA: Insufficient documentation

## 2013-10-25 DIAGNOSIS — G4700 Insomnia, unspecified: Secondary | ICD-10-CM | POA: Diagnosis not present

## 2013-10-25 DIAGNOSIS — Z792 Long term (current) use of antibiotics: Secondary | ICD-10-CM | POA: Insufficient documentation

## 2013-10-25 DIAGNOSIS — J029 Acute pharyngitis, unspecified: Secondary | ICD-10-CM | POA: Diagnosis not present

## 2013-10-25 LAB — CULTURE, GROUP A STREP

## 2013-10-25 MED ORDER — ZOLPIDEM TARTRATE 5 MG PO TABS: 5.0000 mg | ORAL_TABLET | Freq: Every evening | ORAL | Status: DC | PRN

## 2013-10-25 NOTE — ED Notes (Signed)
Patient has not been able to sleep in 48 hours. Patient was prescribed Trazadone and has been taking it but states it is not working. Patient also complaining of headache that is "off and on." Patient is a/o x4. Denies blurred vision, dizziness, n/v.

## 2013-10-25 NOTE — ED Notes (Signed)
PA at bedside.

## 2013-10-25 NOTE — ED Provider Notes (Signed)
CSN: [id number]     Arrival date & time 10/25/13  0358 History   First MD Initiated Contact with Patient 10/25/13 [phone]     Chief Complaint  Patient presents with  . Insomnia     (Consider location/radiation/quality/duration/timing/severity/associated sxs/prior Treatment) HPI Shaun Stokes is a 48 y.o. male who presents to emergency department complaining of difficulty sleeping. Patient states he would doze off but wakes up right away and unable to go back to sleep. States he has not slept in 2 days. He states he has had issues sleeping for the last 8 years, has been taking trazodone as needed. States he took it yesterday and still is unable to go to sleep. Patient states his sister is a Designer, jewellery, who told him he may have sleep apnea. Patient does not have a primary care Dr. and has not been tested for sleep apnea. He admits to snoring. He states yesterday he was seen for sore throat, diagnosed with pharyngitis, treated with penicillin, states it is improving. Patient was also started on prednisone.  History reviewed. No pertinent past medical history. History reviewed. No pertinent past surgical history. No family history on file. History  Substance Use Topics  . Smoking status: Current Every Day Smoker -- 0.50 packs/day    Types: Cigarettes  . Smokeless tobacco: Not on file  . Alcohol Use: Yes    Review of Systems  Constitutional: Negative for fever and chills.  HENT: Positive for sore throat.   Respiratory: Negative for cough, chest tightness and shortness of breath.   Cardiovascular: Negative for chest pain, palpitations and leg swelling.  Gastrointestinal: Negative for nausea, vomiting, abdominal pain, diarrhea and abdominal distention.  Genitourinary: Negative for dysuria, urgency, frequency and hematuria.  Musculoskeletal: Negative for arthralgias, myalgias, neck pain and neck stiffness.  Skin: Negative for rash.  Neurological: Negative for dizziness, weakness,  light-headedness, numbness and headaches.  All other systems reviewed and are negative.     Allergies  Review of patient's allergies indicates no known allergies.  Home Medications   Prior to Admission medications   Medication Sig Start Date End Date Taking? Authorizing Provider  amoxicillin (AMOXIL) 500 MG capsule Take 1,000 mg by mouth 2 (two) times daily. 05/26/22  Yes Delora Fuel, MD  ibuprofen (ADVIL,MOTRIN) 200 MG tablet Take 800 mg by mouth every 8 (eight) hours as needed for moderate pain.   Yes Historical Provider, MD  oxyCODONE-acetaminophen (PERCOCET) 5-325 MG per tablet Take 1 tablet by mouth every 4 (four) hours as needed for moderate pain. 05/13/00  Yes Delora Fuel, MD  predniSONE (DELTASONE) 10 MG tablet Take 2 tablets (20 mg total) by mouth daily. 09/06/34  Yes Delora Fuel, MD   BP 644/03  Pulse 112  Temp(Src) 99 F (37.2 C) (Oral)  Resp 20  Ht 6\' 2"  (1.88 m)  Wt 344 lb (156.037 kg)  BMI 44.15 kg/m2  SpO2 99% Physical Exam  Nursing note and vitals reviewed. Constitutional: He appears well-developed and well-nourished. No distress.  Morbidly obese  HENT:  Head: Normocephalic and atraumatic.  Tonsils enlarged, uvula midline, pharynx erythematous, no exudate  Eyes: Conjunctivae are normal.  Neck: Normal range of motion. Neck supple.  Cardiovascular: Normal rate, regular rhythm and normal heart sounds.   Pulmonary/Chest: Effort normal. No respiratory distress. He has no wheezes. He has no rales.  Musculoskeletal: He exhibits no edema.  Neurological: He is alert.  Skin: Skin is warm and dry.    ED Course  Procedures (including critical care time)  Labs Review Labs Reviewed - No data to display  Imaging Review Ct Soft Tissue Neck W Contrast  10/24/2013   CLINICAL DATA:  Sore throat, difficulty swallowing, and hoarse voice. 48 year old male. Elevated white blood count. Patient is a smoker.  EXAM: CT NECK WITH CONTRAST  TECHNIQUE: Multidetector CT imaging of the  neck was performed using the standard protocol following the bolus administration of intravenous contrast.  CONTRAST:  68mL OMNIPAQUE IOHEXOL 300 MG/ML  SOLN  COMPARISON:  None.  FINDINGS: There is symmetric bilateral prominence of the palatine tonsils. No evidence of tonsillar abscess. There is prominence of the adenoids.  No prevertebral edema or retropharyngeal abscess. Epiglottis is within normal limits.  Incidentally imaged portion of the brain shows no evidence of hydrocephalus or midline shift.  Prominent lymph nodes are seen bilaterally. 2.0 x 1.5 cm right submandibular lymph node. 2.4 x 1.4 cm left submandibular lymph node. Additional smaller reactive size lymph nodes. Enlarged left supraclavicular lymph node measures 1.6 x 1.4 cm.  Enlargement of the left lobe of the thyroid gland with substernal extension. No nodules are seen within the thyroid gland. The left thyroid lobe enlargement causes rightward deviation of the trachea.  Lung apices are clear.  Well defined sclerotic lesion with central lucency in the right clavicle near the clavicular head has benign appearances. Similar but smaller sclerotic lesion with central lucency in the inferior aspect of the left medial clavicle.  Imaged portions of the paranasal sinuses are clear. The mastoid air cells and middle ears are clear. The temporomandibular joints are anteriorly subluxed bilaterally.  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 No acute bony abnormality of the cervical spine identified.  IMPRESSION: 1. Enlargement of the palatine tonsils and adenoid tissue with regional lymphadenopathy in the neck and prominent left supraclavicular lymph node. Considerations include acute tonsillitis/pharyngitis with reactive lymphadenopathy. In an adult patient, the possibility of HIV should be considered. Lymphoma cannot be completely excluded. Clinical followup is necessary. Consideration could be given to repeat neck CT in 2-3 months. 2.  The temporomandibular joints are anteriorly subluxed bilaterally. 3. Enlargement of the left lobe of the thyroid gland with substernal extension. This causes deviation of the trachea to the right.   Electronically Signed   By: Curlene Dolphin M.D.   On: 10/24/2013 07:44     EKG Interpretation None      MDM   Final diagnoses:  Insomnia    Patient with insomnia, will treat with Ambien. He is on prednisone, struck his a stop. He'll think he needs it anymore this time. He states his throat is improving, he had a CT of the soft tissue neck done yesterday which showed a large tonsils and adenoids, also showed irregular thyroid with a tracheal deviation. I discussed this with patient. He will followup with primary care Dr. Also encouraged to followup and get tested for sleep apnea.  Filed Vitals:   10/25/13 0743  BP: 143/80  Pulse: 94  Temp:   Resp: 18      Leaman Abe A Poppi Scantling, PA-C 10/25/13 [phone]

## 2013-10-25 NOTE — Discharge Instructions (Signed)
Take ambien as prescribed as needed to help you sleep. Do not take more than prescribed. Follow up with primary care doctor for recheck of your throat, for further evaluation of your thyroid and for sleep apnea testing.    Insomnia Insomnia is frequent trouble falling and/or staying asleep. Insomnia can be a long term problem or a short term problem. Both are common. Insomnia can be a short term problem when the wakefulness is related to a certain stress or worry. Long term insomnia is often related to ongoing stress during waking hours and/or poor sleeping habits. Overtime, sleep deprivation itself can make the problem worse. Every little thing feels more severe because you are overtired and your ability to cope is decreased. CAUSES   Stress, anxiety, and depression.  Poor sleeping habits.  Distractions such as TV in the bedroom.  Naps close to bedtime.  Engaging in emotionally charged conversations before bed.  Technical reading before sleep.  Alcohol and other sedatives. They may make the problem worse. They can hurt normal sleep patterns and normal dream activity.  Stimulants such as caffeine for several hours prior to bedtime.  Pain syndromes and shortness of breath can cause insomnia.  Exercise late at night.  Changing time zones may cause sleeping problems (jet lag). It is sometimes helpful to have someone observe your sleeping patterns. They should look for periods of not breathing during the night (sleep apnea). They should also look to see how long those periods last. If you live alone or observers are uncertain, you can also be observed at a sleep clinic where your sleep patterns will be professionally monitored. Sleep apnea requires a checkup and treatment. Give your caregivers your medical history. Give your caregivers observations your family has made about your sleep.  SYMPTOMS   Not feeling rested in the morning.  Anxiety and restlessness at bedtime.  Difficulty  falling and staying asleep. TREATMENT   Your caregiver may prescribe treatment for an underlying medical disorders. Your caregiver can give advice or help if you are using alcohol or other drugs for self-medication. Treatment of underlying problems will usually eliminate insomnia problems.  Medications can be prescribed for short time use. They are generally not recommended for lengthy use.  Over-the-counter sleep medicines are not recommended for lengthy use. They can be habit forming.  You can promote easier sleeping by making lifestyle changes such as:  Using relaxation techniques that help with breathing and reduce muscle tension.  Exercising earlier in the day.  Changing your diet and the time of your last meal. No night time snacks.  Establish a regular time to go to bed.  Counseling can help with stressful problems and worry.  Soothing music and white noise may be helpful if there are background noises you cannot remove.  Stop tedious detailed work at least one hour before bedtime. HOME CARE INSTRUCTIONS   Keep a diary. Inform your caregiver about your progress. This includes any medication side effects. See your caregiver regularly. Take note of:  Times when you are asleep.  Times when you are awake during the night.  The quality of your sleep.  How you feel the next day. This information will help your caregiver care for you.  Get out of bed if you are still awake after 15 minutes. Read or do some quiet activity. Keep the lights down. Wait until you feel sleepy and go back to bed.  Keep regular sleeping and waking hours. Avoid naps.  Exercise regularly.  Avoid distractions  at bedtime. Distractions include watching television or engaging in any intense or detailed activity like attempting to balance the household checkbook.  Develop a bedtime ritual. Keep a familiar routine of bathing, brushing your teeth, climbing into bed at the same time each night, listening  to soothing music. Routines increase the success of falling to sleep faster.  Use relaxation techniques. This can be using breathing and muscle tension release routines. It can also include visualizing peaceful scenes. You can also help control troubling or intruding thoughts by keeping your mind occupied with boring or repetitive thoughts like the old concept of counting sheep. You can make it more creative like imagining planting one beautiful flower after another in your backyard garden.  During your day, work to eliminate stress. When this is not possible use some of the previous suggestions to help reduce the anxiety that accompanies stressful situations. MAKE SURE YOU:   Understand these instructions.  Will watch your condition.  Will get help right away if you are not doing well or get worse. Document Released: 01/27/2000 Document Revised: 04/23/2011 Document Reviewed: 02/26/2007 [facility] Patient Information 2015 [place], [place]. This information is not intended to replace advice given to you by your health care provider. Make sure you discuss any questions you have with your health care provider.

## 2013-10-25 NOTE — ED Provider Notes (Signed)
Medical screening examination/treatment/procedure(s) were performed by non-physician practitioner and as supervising physician I was immediately available for consultation/collaboration.   EKG Interpretation None       Kalman Drape, MD 10/25/13 [phone]

## 2013-10-26 ENCOUNTER — Telehealth (HOSPITAL_COMMUNITY): Payer: Self-pay

## 2013-10-26 NOTE — ED Notes (Signed)
Post ED Visit - Positive Culture Follow-up  Culture report reviewed by antimicrobial stewardship pharmacist: []  Wes [place], Pharm.D., BCPS [x]  Heide Guile, Pharm.D., BCPS []  Alycia Rossetti, Pharm.D., BCPS []  [place], Pharm.D., BCPS, AAHIVP []  Legrand Como, Pharm.D., BCPS, AAHIVP []  Carly Sabat, Pharm.D. []  Elenor Quinones, Pharm.D.  Positive throat  culture Treated with amoxicillin, organism sensitive to the same and no further patient follow-up is required at this time.  Ileene Musa 10/26/2013, 10:02 AM

## 2014-01-24 ENCOUNTER — Emergency Department (HOSPITAL_COMMUNITY): Payer: BC Managed Care – PPO

## 2014-01-24 ENCOUNTER — Encounter (HOSPITAL_COMMUNITY): Payer: Self-pay | Admitting: Emergency Medicine

## 2014-01-24 ENCOUNTER — Emergency Department (HOSPITAL_COMMUNITY)
Admission: EM | Admit: 2014-01-24 | Discharge: 2014-01-24 | Disposition: A | Discharge status: Home / Self Care | Payer: BC Managed Care – PPO | Attending: Emergency Medicine | Admitting: Emergency Medicine

## 2014-01-24 DIAGNOSIS — J40 Bronchitis, not specified as acute or chronic: Secondary | ICD-10-CM | POA: Diagnosis not present

## 2014-01-24 DIAGNOSIS — E669 Obesity, unspecified: Secondary | ICD-10-CM | POA: Insufficient documentation

## 2014-01-24 DIAGNOSIS — Z720 Tobacco use: Secondary | ICD-10-CM | POA: Diagnosis not present

## 2014-01-24 DIAGNOSIS — R079 Chest pain, unspecified: Secondary | ICD-10-CM | POA: Diagnosis present

## 2014-01-24 DIAGNOSIS — Z8669 Personal history of other diseases of the nervous system and sense organs: Secondary | ICD-10-CM | POA: Insufficient documentation

## 2014-01-24 HISTORY — DX: Obesity, unspecified: E66.9

## 2014-01-24 HISTORY — DX: Insomnia, unspecified: G47.00

## 2014-01-24 LAB — BASIC METABOLIC PANEL
Anion gap: 15 (ref 5–15)
BUN: 17 mg/dL (ref 6–23)
CO2: 21 mEq/L (ref 19–32)
Calcium: 9.4 mg/dL (ref 8.4–10.5)
Chloride: 97 mEq/L (ref 96–112)
Creatinine, Ser: 1.1 mg/dL (ref 0.50–1.35)
GFR calc Af Amer: >90 mL/min (ref >90)
GFR calc non Af Amer: 78 mL/min — ABNORMAL LOW (ref >90)
Glucose, Bld: 136 mg/dL — ABNORMAL HIGH (ref 70–99)
Potassium: 4.5 mEq/L (ref 3.7–5.3)
Sodium: 133 mEq/L — ABNORMAL LOW (ref 137–147)

## 2014-01-24 LAB — CBC
HCT: 38.9 % — ABNORMAL LOW (ref 39.0–52.0)
Hemoglobin: 13.1 g/dL (ref 13.0–17.0)
MCH: 31.3 pg (ref 26.0–34.0)
MCHC: 33.7 g/dL (ref 30.0–36.0)
MCV: 93.1 fL (ref 78.0–100.0)
Platelets: 294 10*3/uL (ref 150–400)
RBC: 4.18 MIL/uL — ABNORMAL LOW (ref 4.22–5.81)
RDW: 15.3 % (ref 11.5–15.5)
WBC: 17.1 10*3/uL — ABNORMAL HIGH (ref 4.0–10.5)

## 2014-01-24 LAB — I-STAT TROPONIN, ED: TROPONIN I, POC: 0 ng/mL (ref 0.00–0.08)

## 2014-01-24 LAB — D-DIMER, QUANTITATIVE: D-Dimer, Quant: 0.33 ug/mL-FEU (ref 0.00–0.48)

## 2014-01-24 LAB — PRO B NATRIURETIC PEPTIDE: Pro B Natriuretic peptide (BNP): 22.7 pg/mL (ref 0–125)

## 2014-01-24 MED ORDER — AZITHROMYCIN 250 MG PO TABS: 250.0000 mg | ORAL_TABLET | Freq: Every day | ORAL | Status: DC

## 2014-01-24 MED ORDER — ALBUTEROL SULFATE HFA 108 (90 BASE) MCG/ACT IN AERS
2.0000 | INHALATION_SPRAY | Freq: Once | RESPIRATORY_TRACT | Status: AC
  Administered 2014-01-24: 2 via RESPIRATORY_TRACT
  Filled 2014-01-24: qty 6.7

## 2014-01-24 MED ORDER — DEXAMETHASONE 4 MG PO TABS
10.0000 mg | ORAL_TABLET | Freq: Once | ORAL | Status: AC
  Administered 2014-01-24: 10 mg via ORAL
  Filled 2014-01-24: qty 3

## 2014-01-24 NOTE — ED Notes (Signed)
Pt. reports left chest pain with SOB and occasional productive cough onset today , denies nausea or diaphoresis .

## 2014-01-24 NOTE — ED Notes (Signed)
MD at bedside. 

## 2014-01-24 NOTE — ED Provider Notes (Signed)
CSN: [id number]     Arrival date & time 01/24/14  2001 History   First MD Initiated Contact with Patient 01/24/14 2029     Chief Complaint  Patient presents with  . Chest Pain     (Consider location/radiation/quality/duration/timing/severity/associated sxs/prior Treatment) HPI  this patient is a 48 year old male who presents complaining of chest pain. He says  He has had chest pain associated with shortness of breath beginning earlier today. He says he was mildly short of breath yesterday , and has been somewhat short of breath with exertion for several months. He says that today he developed a cough, and has had some production of green sputum. He also complains of pleuritic left-sided chest pain, and says that it is sometimes worse when he moves his left arm.   He says the pain feels sharp , and does not radiate. He denies any lightheadedness or near syncopal episodes.  He has had no fever. He is a smoker, but has no diagnosis of COPD or heart disease.  Past Medical History  Diagnosis Date  . Insomnia   . Obesity    History reviewed. No pertinent past surgical history. No family history on file. History  Substance Use Topics  . Smoking status: Current Every Day Smoker -- 0.00 packs/day    Types: Cigarettes  . Smokeless tobacco: Not on file  . Alcohol Use: Yes    Review of Systems  Constitutional: Positive for fatigue. Negative for fever.  Respiratory: Positive for cough and shortness of breath.   Gastrointestinal: Negative for abdominal pain and anal bleeding.  Musculoskeletal: Negative for back pain and arthralgias.  All other systems reviewed and are negative.     Allergies  Review of patient's allergies indicates no known allergies.  Home Medications   Prior to Admission medications   Medication Sig Start Date End Date Taking? Authorizing Provider  ibuprofen (ADVIL,MOTRIN) 200 MG tablet Take 800 mg by mouth every 8 (eight) hours as needed for moderate pain.   Yes  Historical Provider, MD  traZODone (DESYREL) 50 MG tablet Take 50 mg by mouth at bedtime.   Yes Historical Provider, MD  azithromycin (ZITHROMAX Z-PAK) 250 MG tablet Take 1 tablet (250 mg total) by mouth daily. Take 2 tabs on day one, then 1 tab a day for the next 4 days 01/24/14   Leata Mouse, MD  oxyCODONE-acetaminophen (PERCOCET) 5-325 MG per tablet Take 1 tablet by mouth every 4 (four) hours as needed for moderate pain. Patient not taking: Reported on 01/24/2014 10/11/91   Delora Fuel, MD  predniSONE (DELTASONE) 10 MG tablet Take 2 tablets (20 mg total) by mouth daily. Patient not taking: Reported on 01/24/2014 08/28/94   Delora Fuel, MD  zolpidem (AMBIEN) 5 MG tablet Take 1-2 tablets (5-10 mg total) by mouth at bedtime as needed for sleep. Patient not taking: Reported on 01/24/2014 10/25/13   Tatyana A Kirichenko, PA-C   BP 148/81 mmHg  Pulse 115  Temp(Src) 99.1 F (37.3 C) (Oral)  Resp 25  SpO2 94% Physical Exam  Constitutional: He is oriented to person, place, and time. He appears well-developed and well-nourished. No distress.  HENT:  Head: Normocephalic and atraumatic.  Eyes: Pupils are equal, round, and reactive to light.  Neck: Normal range of motion. No JVD present. No thyromegaly present.  Cardiovascular: Normal rate, regular rhythm and normal heart sounds.   No murmur heard. Pulmonary/Chest: Effort normal. No respiratory distress. He has wheezes.  Abdominal: Soft. He exhibits no mass. There is no  tenderness.  Musculoskeletal: Normal range of motion. He exhibits no edema or tenderness.  Neurological: He is alert and oriented to person, place, and time. No cranial nerve deficit. Coordination normal.  Skin: Skin is warm and dry. No erythema.  Nursing note and vitals reviewed.   ED Course  Procedures (including critical care time) Labs Review Labs Reviewed  CBC - Abnormal; Notable for the following:    WBC 17.1 (*)    RBC 4.18 (*)    HCT 38.9 (*)    All other components  within normal limits  BASIC METABOLIC PANEL - Abnormal; Notable for the following:    Sodium 133 (*)    Glucose, Bld 136 (*)    GFR calc non Af Amer 78 (*)    All other components within normal limits  PRO B NATRIURETIC PEPTIDE  D-DIMER, QUANTITATIVE  I-STAT TROPOININ, ED    Imaging Review Dg Chest 2 View  01/24/2014   CLINICAL DATA:  Left-sided chest pain and shortness of breath beginning today.  EXAM: CHEST  2 VIEW  COMPARISON:  None.  FINDINGS: Cardiac silhouette is upper limits of normal in size. There is moderate peribronchial thickening, slightly greater in the left mid and lower lung. No confluent airspace opacity, over pulmonary edema, pleural effusion, or pneumothorax is identified. No acute osseous abnormality is identified.  IMPRESSION: Peribronchial thickening suggestive of bronchitis or reactive airways disease.   Electronically Signed   By: Logan Bores   On: 01/24/2014 20:48     EKG Interpretation None      MDM   Final diagnoses:  Bronchitis    48 year old male presenting with bronchitis, possibly atypical pneumonia.  Patient is in no acute distress upon his arrival, he has diffuse pain, expiratory wheezes. His chest pain is pleuritic in nature and left-sided, worse with movement of his arm or breathing. He is at low risk for PE by well's criteria, and has a negative d-dimer, PE unlikely. He has no ischemic EKG changes, and a negative troponin.   Chest x-ray consistent with bronchitis , this correlates with his symptoms. We will treat him for bronchitis, and follow-up with his primary care doctor as scheduled next week, return precautions given.   Leata Mouse, MD 01/25/14 2831  Dot Lanes, MD 01/30/14 [phone]

## 2014-01-24 NOTE — Discharge Instructions (Signed)

## 2014-01-24 NOTE — ED Notes (Signed)
Patient transported to X-ray 

## 2014-01-26 ENCOUNTER — Telehealth: Payer: Self-pay

## 2014-02-02 NOTE — Telephone Encounter (Signed)
lmam 02/02/14 

## 2014-02-10 NOTE — Telephone Encounter (Signed)
Fax is gone 

## 2014-04-08 ENCOUNTER — Encounter: Payer: Self-pay | Admitting: Neurology

## 2014-04-08 ENCOUNTER — Institutional Professional Consult (permissible substitution): Payer: Self-pay | Admitting: Neurology

## 2014-04-08 DIAGNOSIS — R7303 Prediabetes: Secondary | ICD-10-CM | POA: Insufficient documentation

## 2014-04-08 DIAGNOSIS — E559 Vitamin D deficiency, unspecified: Secondary | ICD-10-CM | POA: Insufficient documentation

## 2014-04-09 ENCOUNTER — Encounter: Payer: Self-pay | Admitting: Neurology

## 2014-06-02 ENCOUNTER — Emergency Department (HOSPITAL_COMMUNITY): Payer: BLUE CROSS/BLUE SHIELD

## 2014-06-02 ENCOUNTER — Inpatient Hospital Stay (HOSPITAL_COMMUNITY)
Admission: EM | Admit: 2014-06-02 | Discharge: 2014-06-04 | DRG: 153 | Disposition: A | Discharge status: Home / Self Care | Payer: BLUE CROSS/BLUE SHIELD | Attending: Internal Medicine | Admitting: Internal Medicine

## 2014-06-02 ENCOUNTER — Encounter (HOSPITAL_COMMUNITY): Payer: Self-pay | Admitting: Nurse Practitioner

## 2014-06-02 ENCOUNTER — Emergency Department (HOSPITAL_COMMUNITY)
Admission: EM | Admit: 2014-06-02 | Discharge: 2014-06-02 | Disposition: A | Discharge status: Nursing Home (Includes ICF) | Payer: BLUE CROSS/BLUE SHIELD | Source: Home / Self Care | Attending: Family Medicine | Admitting: Family Medicine

## 2014-06-02 ENCOUNTER — Encounter (HOSPITAL_COMMUNITY): Payer: Self-pay | Admitting: *Deleted

## 2014-06-02 DIAGNOSIS — Z6841 Body Mass Index (BMI) 40.0 and over, adult: Secondary | ICD-10-CM | POA: Diagnosis not present

## 2014-06-02 DIAGNOSIS — R590 Localized enlarged lymph nodes: Secondary | ICD-10-CM

## 2014-06-02 DIAGNOSIS — F17210 Nicotine dependence, cigarettes, uncomplicated: Secondary | ICD-10-CM | POA: Diagnosis present

## 2014-06-02 DIAGNOSIS — E669 Obesity, unspecified: Secondary | ICD-10-CM | POA: Diagnosis present

## 2014-06-02 DIAGNOSIS — R7309 Other abnormal glucose: Secondary | ICD-10-CM | POA: Diagnosis present

## 2014-06-02 DIAGNOSIS — E785 Hyperlipidemia, unspecified: Secondary | ICD-10-CM | POA: Diagnosis present

## 2014-06-02 DIAGNOSIS — E559 Vitamin D deficiency, unspecified: Secondary | ICD-10-CM

## 2014-06-02 DIAGNOSIS — R7303 Prediabetes: Secondary | ICD-10-CM | POA: Diagnosis present

## 2014-06-02 DIAGNOSIS — J020 Streptococcal pharyngitis: Secondary | ICD-10-CM | POA: Diagnosis present

## 2014-06-02 DIAGNOSIS — D72829 Elevated white blood cell count, unspecified: Secondary | ICD-10-CM | POA: Diagnosis present

## 2014-06-02 DIAGNOSIS — R070 Pain in throat: Secondary | ICD-10-CM | POA: Diagnosis present

## 2014-06-02 DIAGNOSIS — J029 Acute pharyngitis, unspecified: Secondary | ICD-10-CM | POA: Diagnosis present

## 2014-06-02 LAB — CBC WITH DIFFERENTIAL/PLATELET
BASOS ABS: 0 10*3/uL (ref 0.0–0.1)
Basophils Relative: 0 % (ref 0–1)
EOS ABS: 0.4 10*3/uL (ref 0.0–0.7)
Eosinophils Relative: 2 % (ref 0–5)
HCT: 37.1 % — ABNORMAL LOW (ref 39.0–52.0)
Hemoglobin: 12.6 g/dL — ABNORMAL LOW (ref 13.0–17.0)
LYMPHS ABS: 1.8 10*3/uL (ref 0.7–4.0)
Lymphocytes Relative: 9 % — ABNORMAL LOW (ref 12–46)
MCH: 31.5 pg (ref 26.0–34.0)
MCHC: 34 g/dL (ref 30.0–36.0)
MCV: 92.8 fL (ref 78.0–100.0)
Monocytes Absolute: 1.6 10*3/uL — ABNORMAL HIGH (ref 0.1–1.0)
Monocytes Relative: 8 % (ref 3–12)
NEUTROS ABS: 16.6 10*3/uL — AB (ref 1.7–7.7)
NEUTROS PCT: 81 % — AB (ref 43–77)
Platelets: 284 10*3/uL (ref 150–400)
RBC: 4 MIL/uL — AB (ref 4.22–5.81)
RDW: 15.5 % (ref 11.5–15.5)
WBC: 20.4 10*3/uL — ABNORMAL HIGH (ref 4.0–10.5)

## 2014-06-02 LAB — BASIC METABOLIC PANEL
Anion gap: 8 (ref 5–15)
BUN: 6 mg/dL (ref 6–23)
CHLORIDE: 105 mmol/L (ref 96–112)
CO2: 24 mmol/L (ref 19–32)
CREATININE: 1.03 mg/dL (ref 0.50–1.35)
Calcium: 8.7 mg/dL (ref 8.4–10.5)
GFR calc Af Amer: >90 mL/min (ref >90)
GFR calc non Af Amer: 84 mL/min — ABNORMAL LOW (ref >90)
Glucose, Bld: 104 mg/dL — ABNORMAL HIGH (ref 70–99)
POTASSIUM: 4.6 mmol/L (ref 3.5–5.1)
Sodium: 137 mmol/L (ref 135–145)

## 2014-06-02 LAB — GLUCOSE, CAPILLARY
GLUCOSE-CAPILLARY: 132 mg/dL — AB (ref 70–99)
Glucose-Capillary: 140 mg/dL — ABNORMAL HIGH (ref 70–99)

## 2014-06-02 LAB — I-STAT CG4 LACTIC ACID, ED
LACTIC ACID, VENOUS: 1.28 mmol/L (ref 0.5–2.0)
Lactic Acid, Venous: 1.2 mmol/L (ref 0.5–2.0)

## 2014-06-02 LAB — POCT RAPID STREP A: Streptococcus, Group A Screen (Direct): NEGATIVE

## 2014-06-02 MED ORDER — HYDROMORPHONE HCL 1 MG/ML IJ SOLN
INTRAMUSCULAR | Status: AC
  Filled 2014-06-02: qty 2

## 2014-06-02 MED ORDER — ACETAMINOPHEN 325 MG PO TABS
650.0000 mg | ORAL_TABLET | Freq: Four times a day (QID) | ORAL | Status: DC | PRN
Start: 2014-06-02 — End: 2014-06-04

## 2014-06-02 MED ORDER — SODIUM CHLORIDE 0.9 % IV BOLUS (SEPSIS)
1000.0000 mL | Freq: Once | INTRAVENOUS | Status: AC
  Administered 2014-06-02: 1000 mL via INTRAVENOUS

## 2014-06-02 MED ORDER — BENZOCAINE 20 % MT SOLN
OROMUCOSAL | Status: AC
  Filled 2014-06-02: qty 57

## 2014-06-02 MED ORDER — ONDANSETRON HCL 4 MG/2ML IJ SOLN: 4.0000 mg | Freq: Four times a day (QID) | INTRAMUSCULAR | Status: DC | PRN

## 2014-06-02 MED ORDER — SODIUM CHLORIDE 0.9 % IV SOLN
INTRAVENOUS | Status: AC
  Administered 2014-06-02: 14:00:00 via INTRAVENOUS

## 2014-06-02 MED ORDER — ACETAMINOPHEN 650 MG RE SUPP: 650.0000 mg | Freq: Four times a day (QID) | RECTAL | Status: DC | PRN

## 2014-06-02 MED ORDER — HEPARIN SODIUM (PORCINE) 5000 UNIT/ML IJ SOLN
5000.0000 [IU] | Freq: Three times a day (TID) | INTRAMUSCULAR | Status: DC
  Administered 2014-06-02 – 2014-06-04 (×5): 5000 [IU] via SUBCUTANEOUS
  Filled 2014-06-02 (×6): qty 1

## 2014-06-02 MED ORDER — HYDROMORPHONE HCL 1 MG/ML IJ SOLN: 1.0000 mg | Freq: Once | INTRAMUSCULAR | Status: DC

## 2014-06-02 MED ORDER — DEXAMETHASONE SODIUM PHOSPHATE 10 MG/ML IJ SOLN
10.0000 mg | Freq: Once | INTRAMUSCULAR | Status: AC
  Administered 2014-06-02: 10 mg via INTRAVENOUS
  Filled 2014-06-02: qty 1

## 2014-06-02 MED ORDER — HYDROMORPHONE HCL 1 MG/ML IJ SOLN
2.0000 mg | Freq: Once | INTRAMUSCULAR | Status: AC
  Administered 2014-06-02: 2 mg via INTRAMUSCULAR

## 2014-06-02 MED ORDER — BENZOCAINE (TOPICAL) 20 % EX AERO
INHALATION_SPRAY | Freq: Four times a day (QID) | CUTANEOUS | Status: DC | PRN
  Administered 2014-06-02: 09:00:00 via OROMUCOSAL

## 2014-06-02 MED ORDER — IOHEXOL 300 MG/ML  SOLN
75.0000 mL | Freq: Once | INTRAMUSCULAR | Status: AC | PRN
  Administered 2014-06-02: 75 mL via INTRAVENOUS

## 2014-06-02 MED ORDER — SODIUM CHLORIDE 0.9 % IV SOLN
1.5000 g | Freq: Four times a day (QID) | INTRAVENOUS | Status: DC
  Administered 2014-06-02 – 2014-06-04 (×7): 1.5 g via INTRAVENOUS
  Filled 2014-06-02 (×11): qty 1.5

## 2014-06-02 MED ORDER — ONDANSETRON HCL 4 MG/2ML IJ SOLN
INTRAMUSCULAR | Status: AC
  Filled 2014-06-02: qty 2

## 2014-06-02 MED ORDER — ONDANSETRON HCL 4 MG PO TABS: 4.0000 mg | ORAL_TABLET | Freq: Four times a day (QID) | ORAL | Status: DC | PRN

## 2014-06-02 MED ORDER — CLINDAMYCIN PHOSPHATE 600 MG/50ML IV SOLN
600.0000 mg | Freq: Once | INTRAVENOUS | Status: AC
  Administered 2014-06-02: 600 mg via INTRAVENOUS
  Filled 2014-06-02: qty 50

## 2014-06-02 MED ORDER — ONDANSETRON HCL 4 MG/2ML IJ SOLN
4.0000 mg | Freq: Once | INTRAMUSCULAR | Status: DC
  Filled 2014-06-02: qty 2

## 2014-06-02 MED ORDER — INSULIN ASPART 100 UNIT/ML ~~LOC~~ SOLN
0.0000 [IU] | SUBCUTANEOUS | Status: DC
  Administered 2014-06-02: 1 [IU] via SUBCUTANEOUS
  Administered 2014-06-02: 2 [IU] via SUBCUTANEOUS
  Administered 2014-06-03: 1 [IU] via SUBCUTANEOUS
  Administered 2014-06-03: 2 [IU] via SUBCUTANEOUS
  Administered 2014-06-03: 1 [IU] via SUBCUTANEOUS
  Administered 2014-06-03: 2 [IU] via SUBCUTANEOUS

## 2014-06-02 MED ORDER — POLYETHYLENE GLYCOL 3350 17 G PO PACK
17.0000 g | PACK | Freq: Every day | ORAL | Status: DC | PRN
  Filled 2014-06-02: qty 1

## 2014-06-02 MED ORDER — METHYLPREDNISOLONE SODIUM SUCC 40 MG IJ SOLR
40.0000 mg | Freq: Four times a day (QID) | INTRAMUSCULAR | Status: DC
  Administered 2014-06-02 – 2014-06-03 (×3): 40 mg via INTRAVENOUS
  Filled 2014-06-02 (×7): qty 1

## 2014-06-02 MED ORDER — ONDANSETRON HCL 4 MG/2ML IJ SOLN
4.0000 mg | Freq: Once | INTRAMUSCULAR | Status: AC
  Administered 2014-06-02: 4 mg via INTRAMUSCULAR

## 2014-06-02 MED ORDER — OXYCODONE-ACETAMINOPHEN 5-325 MG PO TABS: 1.0000 | ORAL_TABLET | ORAL | Status: DC | PRN

## 2014-06-02 MED ORDER — MORPHINE SULFATE 4 MG/ML IJ SOLN
4.0000 mg | Freq: Once | INTRAMUSCULAR | Status: DC
  Filled 2014-06-02: qty 1

## 2014-06-02 MED ORDER — ZOLPIDEM TARTRATE 5 MG PO TABS: 5.0000 mg | ORAL_TABLET | Freq: Every evening | ORAL | Status: DC | PRN

## 2014-06-02 NOTE — H&P (Signed)
[facility] History and Physical  [name]:[id number] DOB: November 17, 1965 DOA: 06/02/2014  Referring physician: Dr. Elliot Cousin PCP: Default, Provider, MD   Chief Complaint: throat pain  HPI: Shaun Stokes is a 49 y.o. male  With past medical history past medical history severe pharyngitis back in 9 2015 that comes in  Throat pain that started today prior to admission he relates to have progressively gotten worse. He describes pain with swallowing but he also cannot swallow. He's been having fever some chills. Has been nauseated and vomiting.   he denies any sick contacts. He's never been tested for immunodeficiencies.   ED: ENT was consulted   That performed laryngoscopy he denies see any abscesses, CT scan of this neck was done-did not show any abscesses so we are consulted for further evaluation. He's been having fevers here in the ED [place] tachycardic and was started on empiric antibiotics.   Review of Systems:  Constitutional:  No weight loss, night sweats, Fevers, chills, fatigue.  HEENT:  No headaches, Difficulty swallowing,Tooth/dental problems,Sore throat,  No sneezing, itching, ear ache, nasal congestion, post nasal drip,  Cardio-vascular:  No chest pain, Orthopnea, PND, swelling in lower extremities, anasarca, dizziness, palpitations  GI:  No heartburn, indigestion, abdominal pain, nausea, vomiting, diarrhea, change in bowel habits, loss of appetite  Resp:  No shortness of breath with exertion or at rest. No excess mucus, no productive cough, No non-productive cough, No coughing up of blood.No change in color of mucus.No wheezing.No chest wall deformity  Skin:  no rash or lesions.  GU:  no dysuria, change in color of urine, no urgency or frequency. No flank pain.  Musculoskeletal:  No joint pain or swelling. No decreased range of motion. No back pain.  Psych:  No change in mood or affect. No depression or anxiety. No memory loss.   Past Medical History    Diagnosis Date  . Insomnia   . Obesity   . Prediabetes   . Hyperlipemia   . Vitamin D deficiency   . Chest pain    Past Surgical History  Procedure Laterality Date  . No past surgeries     Social History:  reports that he has been smoking Cigarettes.  He has been smoking about 0.00 packs per day. He has never used smokeless tobacco. He reports that he drinks alcohol. He reports that he does not use illicit drugs.  No Known Allergies  Family History  Problem Relation Age of Onset  . Heart attack Father    Prior to Admission medications   Medication Sig Start Date End Date Taking? Authorizing Provider  acetaminophen (TYLENOL) 500 MG tablet Take 30,000 mg by mouth every 4 (four) hours as needed for mild pain or moderate pain.   Yes Historical Provider, MD  azithromycin (ZITHROMAX Z-PAK) 250 MG tablet Take 1 tablet (250 mg total) by mouth daily. Take 2 tabs on day one, then 1 tab a day for the next 4 days Patient not taking: Reported on 06/02/2014 01/24/14   Leata Mouse, MD  oxyCODONE-acetaminophen (PERCOCET) 5-325 MG per tablet Take 1 tablet by mouth every 4 (four) hours as needed for moderate pain. Patient not taking: Reported on 06/02/2014 9/56/21   Delora Fuel, MD  predniSONE (DELTASONE) 10 MG tablet Take 2 tablets (20 mg total) by mouth daily. Patient not taking: Reported on 06/02/2014 04/20/63   Delora Fuel, MD  zolpidem (AMBIEN) 5 MG tablet Take 1-2 tablets (5-10 mg total) by mouth at bedtime as needed for  sleep. Patient not taking: Reported on 06/02/2014 10/25/13   Jeannett Senior, PA-C   Physical Exam: Filed Vitals:   06/02/14 1315 06/02/14 1330 06/02/14 1345 06/02/14 1400  BP: 124/75 120/70 112/73 129/65  Pulse: 112 107 110 109  Temp:      TempSrc:      Resp:      Height:      Weight:      SpO2: 95% 97% 96% 97%    Wt Readings from Last 3 Encounters:  06/02/14 158.759 kg (350 lb)  10/25/13 156.037 kg (344 lb)  10/24/13 154.223 kg (340 lb)    General:  Appears  calm and comfortable , continually spitting up secretions. Eyes: PERRL, normal lids, irises & conjunctiva ENT:  There is some tonsillar exudate and the uvula appears swollen. Neck:  Neck is thick he does have some tension overall the lateral side of his neck Cardiovascular:  Tachycardic with regular rate, no m/r/g. No LE edema. Telemetry: SR, no arrhythmias  Respiratory: CTA bilaterally, no w/r/r. Normal respiratory effort. Abdomen: soft, ntnd Skin: no rash or induration seen on limited exam Musculoskeletal: grossly normal tone BUE/BLE Psychiatric: grossly normal mood and affect, speech fluent and appropriate Neurologic: grossly non-focal.          Labs on Admission:  Basic Metabolic Panel:  Recent Labs Lab 06/02/14 1027  NA 137  K 4.6  CL 105  CO2 24  GLUCOSE 104*  BUN 6  CREATININE 1.03  CALCIUM 8.7   Liver Function Tests: No results for input(s): AST, ALT, ALKPHOS, BILITOT, PROT, ALBUMIN in the last 168 hours. No results for input(s): LIPASE, AMYLASE in the last 168 hours. No results for input(s): AMMONIA in the last 168 hours. CBC:  Recent Labs Lab 06/02/14 1027  WBC 20.4*  NEUTROABS 16.6*  HGB 12.6*  HCT 37.1*  MCV 92.8  PLT 284   Cardiac Enzymes: No results for input(s): CKTOTAL, CKMB, CKMBINDEX, TROPONINI in the last 168 hours.  BNP (last 3 results) No results for input(s): BNP in the last 8760 hours.  ProBNP (last 3 results)  Recent Labs  01/24/14 2009  PROBNP 22.7    CBG: No results for input(s): GLUCAP in the last 168 hours.  Radiological Exams on Admission: Ct Soft Tissue Neck W Contrast  06/02/2014   CLINICAL DATA:  Sore throat  EXAM: CT NECK WITH CONTRAST  TECHNIQUE: Multidetector CT imaging of the neck was performed using the standard protocol following the bolus administration of intravenous contrast.  CONTRAST:  39mL OMNIPAQUE IOHEXOL 300 MG/ML  SOLN  COMPARISON:  CT neck 10/24/2013  FINDINGS: Pharynx and larynx: Diffuse enlargement of  the adenoid lymph tissue has improved since the prior study. Bilateral tonsillar enlargement left greater than right has improved in the interval. This causes narrowing of the oropharynx. No abscess. Epiglottis and larynx normal.  Salivary glands: Negative  Thyroid: Limited detail due to large patient size and low technique. Left thyroid goiter extending posteriorly unchanged. This displaces the trachea to the right. The goiter extends into the superior mediastinum on the left and measures approximately 35 mm unchanged.  Lymph nodes: Right level 2 lymph node 15 mm unchanged from the prior study. Posterior node on the right 13 mm unchanged. Left level 2 lymph node 16 mm in diameter unchanged. 15 mm posterior lymph node at the level of the hyoid is unchanged.  Vascular: Limited evaluation due to patient size.  Limited intracranial: Negative  Visualized orbits: Not imaged  Mastoids and visualized paranasal  sinuses: Negative  Skeleton: Limited due to patient size  Upper chest: Lung apices clear  IMPRESSION: Hypertrophy of the adenoid and tonsil lymphoid tissue is present bilaterally. This has improved since the prior CT. No peritonsillar abscess  Cervical adenopathy is similar to the prior CT and most likely reactive adenopathy related to pharyngitis.  Left thyroid goiter extending into the superior mediastinum unchanged from the prior study.  Limited study due to patient size.   Electronically Signed   By: Franchot Gallo M.D.   On: 06/02/2014 13:31    EKG: Independently reviewed. none  Assessment/Plan Pharyngitis: - Start him on IV steroids and IV antibiotics. - Started on clindamycin doubt blood cultures will be helpful. - Appreciate ENTs assistance. - Place him nothing by mouth, please him on a pulse oximetry. Start steroids check blood cultures.  Prediabetes: -  Nothing by mouth check hemoglobin A1c  Leukocytosis -  Most likely due to infectious etiology.  Obesity -  Counseling.   Code Status:  full DVT Prophylaxis:heparin Family Communication: Able to answer questions. Disposition Plan: inpatient  Time spent: 65 minutes  Charlynne Cousins [facility] Pager [phone]

## 2014-06-02 NOTE — ED Notes (Signed)
Hospitalist at bedside 

## 2014-06-02 NOTE — ED Notes (Signed)
Pt   resports   Symptoms  Of  sorethroat       Fever   -  Hurts  To  Swallow             Symptoms  Began  Yesterday         Pt  Is  Sitting  Upright   On  Exam table        Speech is  Slow  And          Yet     Clear

## 2014-06-02 NOTE — Progress Notes (Signed)
Pt. Arrived to the unit. Pt. Is alert and oriented with no signs of distress noted. Pt. Vitals appear stable with no skin issues noted. Educated pt. On use of staff numbers, room telephone and call bell. Call light within reach. Orders released. No further needs noted at this time. 

## 2014-06-02 NOTE — ED Provider Notes (Signed)
CSN: [id number]     Arrival date & time 06/02/14  0805 History   First MD Initiated Contact with Patient 06/02/14 [phone]     Chief Complaint  Patient presents with  . Sore Throat   (Consider location/radiation/quality/duration/timing/severity/associated sxs/prior Treatment) Patient is a 49 y.o. male presenting with pharyngitis. The history is provided by the patient. No language interpreter was used.  Sore Throat This is a new problem. The current episode started 2 days ago. The problem occurs constantly. The problem has been gradually worsening. Pertinent negatives include no headaches and no shortness of breath. Nothing aggravates the symptoms. Nothing relieves the symptoms. He has tried nothing for the symptoms. The treatment provided no relief.  Pt complains of severe throat pain.   Pt has difficulty opening mouth.  Pt spitting yellow saliva in a basin.    Past Medical History  Diagnosis Date  . Insomnia   . Obesity   . Prediabetes   . Hyperlipemia   . Vitamin D deficiency   . Chest pain    Past Surgical History  Procedure Laterality Date  . No past surgeries     No family history on file. History  Substance Use Topics  . Smoking status: Current Every Day Smoker -- 0.00 packs/day    Types: Cigarettes  . Smokeless tobacco: Never Used  . Alcohol Use: 0.0 oz/week    0 Standard drinks or equivalent per week    Review of Systems  HENT: Positive for sore throat.   Respiratory: Negative for shortness of breath.   Neurological: Negative for headaches.  All other systems reviewed and are negative.   Allergies  Review of patient's allergies indicates no known allergies.  Home Medications   Prior to Admission medications   Medication Sig Start Date End Date Taking? Authorizing Provider  azithromycin (ZITHROMAX Z-PAK) 250 MG tablet Take 1 tablet (250 mg total) by mouth daily. Take 2 tabs on day one, then 1 tab a day for the next 4 days 01/24/14   Leata Mouse, MD  hydrOXYzine  (ATARAX/VISTARIL) 25 MG tablet Take 25 mg by mouth daily. 03/04/14   Historical Provider, MD  ibuprofen (ADVIL,MOTRIN) 200 MG tablet Take 800 mg by mouth every 8 (eight) hours as needed for moderate pain.    Historical Provider, MD  oxyCODONE-acetaminophen (PERCOCET) 5-325 MG per tablet Take 1 tablet by mouth every 4 (four) hours as needed for moderate pain. 6/43/32   Delora Fuel, MD  predniSONE (DELTASONE) 10 MG tablet Take 2 tablets (20 mg total) by mouth daily. 9/51/88   Delora Fuel, MD  traZODone (DESYREL) 50 MG tablet Take 50 mg by mouth at bedtime.    Historical Provider, MD  zolpidem (AMBIEN) 5 MG tablet Take 1-2 tablets (5-10 mg total) by mouth at bedtime as needed for sleep. 10/25/13   Tatyana Kirichenko, PA-C   BP 152/75 mmHg  Pulse 120  Temp(Src) 101.1 F (38.4 C) (Oral)  Resp 18  SpO2 100% Physical Exam  Constitutional: He appears well-developed.  HENT:  Head: Normocephalic and atraumatic.  Nose: Nose normal.  Trismus,  I am unable to see tonsils,  Tongue coated white, with hurricane spray I am only able to see approx 47mm of throat with pt gagging. I am unable to see tonsils or uvula.    Eyes: Pupils are equal, round, and reactive to light.  Neck:  Tender anterior throat  Cardiovascular:  tachycardia  Pulmonary/Chest: Effort normal.  Musculoskeletal: Normal range of motion.  Neurological: He is alert.  Skin: Skin is warm.  Psychiatric: He has a normal mood and affect.    ED Course  Procedures (including critical care time) Labs Review Labs Reviewed - No data to display  Imaging Review No results found.   MDM  Pt given dilaudid and zofran for discomfort.   I am concerned about tonsillar abscess.  I will send to ED for evaluation.   1. Throat pain    To ED now    Fransico Meadow, PA-C 06/02/14 Reeder, PA-C 06/02/14 [phone]

## 2014-06-02 NOTE — Discharge Instructions (Signed)
Go to the ED now 

## 2014-06-02 NOTE — ED Notes (Signed)
Dr. Janace Hoard at bedside requesting CT to be done sooner rather than later. CT notified, will send for someone to come get pt ASAP.

## 2014-06-02 NOTE — ED Notes (Signed)
Pt coughing up a lot of pus, when he talks he sounds partially obstructed, worried for airway. PA Tiffany aware. Having difficulty reaching Dr. Janace Hoard with ENT

## 2014-06-02 NOTE — Consult Note (Signed)
Reason for Consult:sore throat Referring Physician: er  BENIAH MAGNAN is an 49 y.o. male.  HPI: hx of sorethroat for 2 days and left slightly worse than right. He denies previous sorethroat issues but he has a CT scan from 10/2013 for sore throat. He is spitting his salivia. He has not had any treatment. He did not have upper respiratory infection prior to onset. Previously the ct scan had shown large thyroid and lymphadenopathy. Unclear if this was worked up for [facility].   Past Medical History  Diagnosis Date  . Insomnia   . Obesity   . Prediabetes   . Hyperlipemia   . Vitamin D deficiency   . Chest pain     Past Surgical History  Procedure Laterality Date  . No past surgeries      No family history on file.  Social History:  reports that he has been smoking Cigarettes.  He has been smoking about 0.00 packs per day. He has never used smokeless tobacco. He reports that he drinks alcohol. He reports that he does not use illicit drugs.  Allergies: No Known Allergies  Medications: I have reviewed the patient's current medications.  Results for orders placed or performed during the hospital encounter of 06/02/14 (from the past 48 hour(s))  CBC with Differential/Platelet     Status: Abnormal   Collection Time: 06/02/14 10:27 AM  Result Value Ref Range   WBC 20.4 (H) 4.0 - 10.5 K/uL   RBC 4.00 (L) 4.22 - 5.81 MIL/uL   Hemoglobin 12.6 (L) 13.0 - 17.0 g/dL   HCT 37.1 (L) 39.0 - 52.0 %   MCV 92.8 78.0 - 100.0 fL   MCH 31.5 26.0 - 34.0 pg   MCHC 34.0 30.0 - 36.0 g/dL   RDW 15.5 11.5 - 15.5 %   Platelets 284 150 - 400 K/uL   Neutrophils Relative % 81 (H) 43 - 77 %   Neutro Abs 16.6 (H) 1.7 - 7.7 K/uL   Lymphocytes Relative 9 (L) 12 - 46 %   Lymphs Abs 1.8 0.7 - 4.0 K/uL   Monocytes Relative 8 3 - 12 %   Monocytes Absolute 1.6 (H) 0.1 - 1.0 K/uL   Eosinophils Relative 2 0 - 5 %   Eosinophils Absolute 0.4 0.0 - 0.7 K/uL   Basophils Relative 0 0 - 1 %   Basophils Absolute 0.0 0.0  - 0.1 K/uL  Basic metabolic panel     Status: Abnormal   Collection Time: 06/02/14 10:27 AM  Result Value Ref Range   Sodium 137 135 - 145 mmol/L   Potassium 4.6 3.5 - 5.1 mmol/L   Chloride 105 96 - 112 mmol/L   CO2 24 19 - 32 mmol/L   Glucose, Bld 104 (H) 70 - 99 mg/dL   BUN 6 6 - 23 mg/dL   Creatinine, Ser 1.03 0.50 - 1.35 mg/dL   Calcium 8.7 8.4 - 10.5 mg/dL   GFR calc non Af Amer 84 (L) >90 mL/min   GFR calc Af Amer >90 >90 mL/min    Comment: (NOTE) The eGFR has been calculated using the CKD EPI equation. This calculation has not been validated in all clinical situations. eGFR's persistently <90 mL/min signify possible Chronic Kidney Disease.    Anion gap 8 5 - 15  I-Stat CG4 Lactic Acid, ED     Status: None   Collection Time: 06/02/14 10:48 AM  Result Value Ref Range   Lactic Acid, Venous 1.20 0.5 - 2.0 mmol/L  No results found.  ROS Blood pressure 124/75, pulse 112, temperature 99.4 F (37.4 C), temperature source Oral, resp. rate 20, height 6' 2"  (1.88 m), weight 158.759 kg (350 lb), SpO2 95 %. Physical Exam  Constitutional: He appears well-developed.  HENT:  He has some distress but no stridor or airway issues. He is spitting up secretions. Op/oc- there is large tongue but not abnormal swelling. There is exudate on the tonsils bilaterally but I cannot tell if there is any abscess. The uvula is swollen. Neck has thickness and some adenopathy. FOE- the adenoids and tonsils are enlarged and there is al ot of exudate. Uvula swollen. There is mucus over the glottis but the vocal cords look open and nl as well as the epiglottis.   Eyes: Conjunctivae are normal. Pupils are equal, round, and reactive to light.    Assessment/Plan: Pharyngitis- it does not appear by ct scan that he has any abscess. His glottis and epiglottis do not look edematous. He has large tonsils bilaterally. His  FOE was consistent with enlarged tonsils and adenoids which is what he has had since 10/2013  last year. His ct scan is not much different than back than regarding the adenopathy.  He needs medical therapy for his pharyngitis and that should resolve with iv antibiotics. He needs work up for adenopathy r/o lymphoma or HIV etc.   Melissa Montane 06/02/2014, 1:28 PM

## 2014-06-02 NOTE — ED Provider Notes (Signed)
CSN: [id number]     Arrival date & time 06/02/14  6295 History   First MD Initiated Contact with Patient 06/02/14 1003     Chief Complaint  Patient presents with  . Sore Throat     (Consider location/radiation/quality/duration/timing/severity/associated sxs/prior Treatment) HPI   LEVEL V caveat-  Pt having difficulty speaking due to pain.  PCP: Default, Provider, MD Blood pressure 142/74, pulse 115, temperature 99.4 F (37.4 C), temperature source Oral, resp. rate 20, height 6\' 2"  (1.88 m), weight 350 lb (158.759 kg), SpO2 95 %.  Shaun Stokes is a 49 y.o.male with a significant PMH of insomnia, obesity, prediabetes, hyperlipemia, vitamin D deficiency, chest pain presents to the ER sent by personal vehicle from Urgent Care. He has had sore throat, fevers, inability to swallow or drink anything due to pain. He reports pain charted 2 days ago on the left side of his neck. He tastes discharge. He now has muffled voice. Describes the pain as severe. Patient describes being unable to open his mouth all the way due to the pain. Denies having history of tonsillar abscess in the past. At the urgent care he received a shot of pain medication, fever 101.1 and it has improved to 99.4.  He is tachycardic to 115-122.    Past Medical History  Diagnosis Date  . Insomnia   . Obesity   . Prediabetes   . Hyperlipemia   . Vitamin D deficiency   . Chest pain    Past Surgical History  Procedure Laterality Date  . No past surgeries     No family history on file. History  Substance Use Topics  . Smoking status: Current Every Day Smoker -- 0.00 packs/day    Types: Cigarettes  . Smokeless tobacco: Never Used  . Alcohol Use: 0.0 oz/week    0 Standard drinks or equivalent per week    Review of Systems  10 Systems reviewed and are negative for acute change except as noted in the HPI.   Allergies  Review of patient's allergies indicates no known allergies.  Home Medications   Prior to  Admission medications   Medication Sig Start Date End Date Taking? Authorizing Provider  acetaminophen (TYLENOL) 500 MG tablet Take 30,000 mg by mouth every 4 (four) hours as needed for mild pain or moderate pain.   Yes Historical Provider, MD  azithromycin (ZITHROMAX Z-PAK) 250 MG tablet Take 1 tablet (250 mg total) by mouth daily. Take 2 tabs on day one, then 1 tab a day for the next 4 days Patient not taking: Reported on 06/02/2014 01/24/14   Leata Mouse, MD  oxyCODONE-acetaminophen (PERCOCET) 5-325 MG per tablet Take 1 tablet by mouth every 4 (four) hours as needed for moderate pain. Patient not taking: Reported on 06/02/2014 2/84/13   Delora Fuel, MD  predniSONE (DELTASONE) 10 MG tablet Take 2 tablets (20 mg total) by mouth daily. Patient not taking: Reported on 06/02/2014 2/44/01   Delora Fuel, MD  zolpidem (AMBIEN) 5 MG tablet Take 1-2 tablets (5-10 mg total) by mouth at bedtime as needed for sleep. Patient not taking: Reported on 06/02/2014 10/25/13   Tatyana Kirichenko, PA-C   BP 112/73 mmHg  Pulse 110  Temp(Src) 99.4 F (37.4 C) (Oral)  Resp 20  Ht 6\' 2"  (1.88 m)  Wt 350 lb (158.759 kg)  BMI 44.92 kg/m2  SpO2 96% Physical Exam  Constitutional: He is oriented to person, place, and time. He appears well-developed and well-nourished. He appears ill. He appears distressed.  HENT:  Head: Normocephalic and atraumatic.  Right Ear: Tympanic membrane, external ear and ear canal normal.  Left Ear: Tympanic membrane, external ear and ear canal normal.  Nose: Nose normal. No rhinorrhea. Right sinus exhibits no maxillary sinus tenderness and no frontal sinus tenderness. Left sinus exhibits no maxillary sinus tenderness and no frontal sinus tenderness.  Mouth/Throat: Uvula is midline and mucous membranes are normal. There is trismus in the jaw. Normal dentition. No dental abscesses or uvula swelling.   I am unable to visualize tonsils or posterior structures due to severe trismus. I am concerned  about impending airway obstruction. Pt is not drooling but must spit out saliva into basin which is yellow/green sputum. Hot potato voice. Roof of mouth and tongue is covered in a coat of purulent discharge.  Eyes: Conjunctivae are normal.  Neck: Trachea normal, normal range of motion and full passive range of motion without pain. Neck supple. No rigidity. Normal range of motion present. No Brudzinski's sign noted.  Flexion and extension of neck without pain or difficulty. Able to breath without difficulty in extension.  Cardiovascular: Regular rhythm.  Tachycardia present.   Pulmonary/Chest: Effort normal and breath sounds normal. No stridor. No respiratory distress. He has no wheezes.  Abdominal: Soft. There is no tenderness.  No obvious evidence of splenomegaly. Non ttp.   Musculoskeletal: Normal range of motion.  Lymphadenopathy:       Head (right side): No preauricular and no posterior auricular adenopathy present.       Head (left side): No preauricular and no posterior auricular adenopathy present.    He has cervical adenopathy.  Neurological: He is alert and oriented to person, place, and time.  Skin: Skin is warm. No rash noted. He is diaphoretic.  Psychiatric: He has a normal mood and affect.  Nursing note and vitals reviewed.   ED Course  Procedures (including critical care time) Labs Review Labs Reviewed  CBC WITH DIFFERENTIAL/PLATELET - Abnormal; Notable for the following:    WBC 20.4 (*)    RBC 4.00 (*)    Hemoglobin 12.6 (*)    HCT 37.1 (*)    Neutrophils Relative % 81 (*)    Neutro Abs 16.6 (*)    Lymphocytes Relative 9 (*)    Monocytes Absolute 1.6 (*)    All other components within normal limits  BASIC METABOLIC PANEL - Abnormal; Notable for the following:    Glucose, Bld 104 (*)    GFR calc non Af Amer 84 (*)    All other components within normal limits  CULTURE, GROUP A STREP  I-STAT CG4 LACTIC ACID, ED  I-STAT CG4 LACTIC ACID, ED    Imaging Review Ct  Soft Tissue Neck W Contrast  06/02/2014   CLINICAL DATA:  Sore throat  EXAM: CT NECK WITH CONTRAST  TECHNIQUE: Multidetector CT imaging of the neck was performed using the standard protocol following the bolus administration of intravenous contrast.  CONTRAST:  84mL OMNIPAQUE IOHEXOL 300 MG/ML  SOLN  COMPARISON:  CT neck 10/24/2013  FINDINGS: Pharynx and larynx: Diffuse enlargement of the adenoid lymph tissue has improved since the prior study. Bilateral tonsillar enlargement left greater than right has improved in the interval. This causes narrowing of the oropharynx. No abscess. Epiglottis and larynx normal.  Salivary glands: Negative  Thyroid: Limited detail due to large patient size and low technique. Left thyroid goiter extending posteriorly unchanged. This displaces the trachea to the right. The goiter extends into the superior mediastinum on the left and  measures approximately 35 mm unchanged.  Lymph nodes: Right level 2 lymph node 15 mm unchanged from the prior study. Posterior node on the right 13 mm unchanged. Left level 2 lymph node 16 mm in diameter unchanged. 15 mm posterior lymph node at the level of the hyoid is unchanged.  Vascular: Limited evaluation due to patient size.  Limited intracranial: Negative  Visualized orbits: Not imaged  Mastoids and visualized paranasal sinuses: Negative  Skeleton: Limited due to patient size  Upper chest: Lung apices clear  IMPRESSION: Hypertrophy of the adenoid and tonsil lymphoid tissue is present bilaterally. This has improved since the prior CT. No peritonsillar abscess  Cervical adenopathy is similar to the prior CT and most likely reactive adenopathy related to pharyngitis.  Left thyroid goiter extending into the superior mediastinum unchanged from the prior study.  Limited study due to patient size.   Electronically Signed   By: Franchot Gallo M.D.   On: 06/02/2014 13:31     EKG Interpretation None      MDM   Final diagnoses:  Pharyngitis  Cervical  lymphadenopathy   Medications  ondansetron (ZOFRAN) injection 4 mg (4 mg Intravenous Not Given 06/02/14 1024)  0.9 %  sodium chloride infusion (not administered)  sodium chloride 0.9 % bolus 1,000 mL (0 mLs Intravenous Stopped 06/02/14 1211)  dexamethasone (DECADRON) injection 10 mg (10 mg Intravenous Given 06/02/14 1021)  clindamycin (CLEOCIN) IVPB 600 mg (0 mg Intravenous Stopped 06/02/14 1102)  sodium chloride 0.9 % bolus 1,000 mL (0 mLs Intravenous Stopped 06/02/14 1246)  iohexol (OMNIPAQUE) 300 MG/ML solution 75 mL (75 mLs Intravenous Contrast Given 06/02/14 1258)   10: 45 am I called Dr. Gillis Santa on high clinical suspicion for significant abscess to left tonsil at 10: 40 am. He is currently in surgery and unable to call me back until he is out of the OR. Secretary reports there is not other ENT provider to call. She agrees to try calling another ENT but unable to get a hold of anyone else. Updated to [facility] [place] on patient status. Will or CT scan of soft tissue neck with contrast for further evaluation while waiting for ENT to get out of the OR.  12: 05 pm I spoke with Dr. Janace Hoard. he is aware that soft tissue CT of the neck w/contrast is pending. I made him aware I am concerned about patients airway due to infection, he has agreed to come see patient as soon as he can.  1:47 pm  Dr. Janace Hoard says the patient has a severe case of pharyngitis. No abscess. The secretions are saliva. Patient has WBC of 20.4, HBG of 12.6, he has had 2 negative lactic acids.  His CT scan of the neck shows significant cervical lymphadenopathy.  Dr. Janace Hoard recommend work-up for immune disease like HIV as an inpatient to [facility] for further evaluation.  Filed Vitals:   06/02/14 1345  BP: 112/73  Pulse: 110  Temp:   Resp:    2:00 pm Patient admitted to [facility] for severe pharyngitis due to being unable to swallow his medication due to severe pain and swelling. Dr. Venetia Constable has agreed to admit, inpatient, medsurg,  [facility]  Delos Haring, PA-C 06/02/14 [place], MD 06/04/14 [phone]

## 2014-06-02 NOTE — ED Notes (Signed)
At this time, Dr. Janace Hoard has been notified by Tiffany PA that pt airway is in danger due to this  tonsillar abscess to left tonsil. Janace Hoard is aware and states he will try to be here in 30 minutes.

## 2014-06-02 NOTE — ED Notes (Signed)
ENT Cart at bedside with fiberoptic scope as requested by MD Janace Hoard.

## 2014-06-02 NOTE — ED Notes (Signed)
Patient came from [facility] to be evaluated for throat abscess that he noticed yesterday. RN unable to visualized back of throat due to patients pain in opening mouth. White/yellow discharge noted in roof of mouth. Patient left side of jaw tender to palpation. Patient able to speak in full sentences and airway is intact at present time. Patient sts has not been able to drink water today due to pain.

## 2014-06-03 LAB — BASIC METABOLIC PANEL
ANION GAP: 11 (ref 5–15)
BUN: 13 mg/dL (ref 6–23)
CALCIUM: 8.6 mg/dL (ref 8.4–10.5)
CO2: 22 mmol/L (ref 19–32)
CREATININE: 0.9 mg/dL (ref 0.50–1.35)
Chloride: 105 mmol/L (ref 96–112)
GFR calc Af Amer: >90 mL/min (ref >90)
GFR calc non Af Amer: >90 mL/min (ref >90)
Glucose, Bld: 143 mg/dL — ABNORMAL HIGH (ref 70–99)
Potassium: 4 mmol/L (ref 3.5–5.1)
Sodium: 138 mmol/L (ref 135–145)

## 2014-06-03 LAB — CBC
HCT: 36.9 % — ABNORMAL LOW (ref 39.0–52.0)
HEMOGLOBIN: 12.3 g/dL — AB (ref 13.0–17.0)
MCH: 30.9 pg (ref 26.0–34.0)
MCHC: 33.3 g/dL (ref 30.0–36.0)
MCV: 92.7 fL (ref 78.0–100.0)
Platelets: 249 10*3/uL (ref 150–400)
RBC: 3.98 MIL/uL — AB (ref 4.22–5.81)
RDW: 15.4 % (ref 11.5–15.5)
WBC: 20.2 10*3/uL — ABNORMAL HIGH (ref 4.0–10.5)

## 2014-06-03 LAB — HEMOGLOBIN A1C
HEMOGLOBIN A1C: 6.6 % — AB (ref 4.8–5.6)
Mean Plasma Glucose: 143 mg/dL

## 2014-06-03 LAB — GLUCOSE, CAPILLARY
GLUCOSE-CAPILLARY: 126 mg/dL — AB (ref 70–99)
Glucose-Capillary: 137 mg/dL — ABNORMAL HIGH (ref 70–99)
Glucose-Capillary: 139 mg/dL — ABNORMAL HIGH (ref 70–99)
Glucose-Capillary: 171 mg/dL — ABNORMAL HIGH (ref 70–99)
Glucose-Capillary: 170 mg/dL — ABNORMAL HIGH (ref 70–99)
Glucose-Capillary: 142 mg/dL — ABNORMAL HIGH (ref 70–99)

## 2014-06-03 MED ORDER — METHYLPREDNISOLONE SODIUM SUCC 40 MG IJ SOLR
40.0000 mg | Freq: Two times a day (BID) | INTRAMUSCULAR | Status: DC
  Filled 2014-06-03 (×2): qty 1

## 2014-06-03 NOTE — Plan of Care (Signed)
Problem: Discharge Progression Outcomes Goal: Tolerating diet Outcome: Progressing Pt. Tolerating Full Liquids after being NPO 06/03/2014

## 2014-06-03 NOTE — Progress Notes (Signed)
PROGRESS NOTE  Shaun Stokes:[id number] DOB: 1965-11-29 DOA: 06/02/2014 PCP: Benito Mccreedy, MD  Assessment/Plan: Pharyngitis: -  IV antibiotics. - Started on clindamycin doubt blood cultures will be helpful. - Appreciate ENTs assistance. - full liquid- advance as tolerated  Prediabetes: - ssi  Leukocytosis - Most likely due to infectious etiology.  Obesity - Counseling.  Code Status: full Family Communication: patient Disposition Plan:    Consultants:    Procedures:      HPI/Subjective: Wants to eat and go home  Objective: Filed Vitals:   06/03/14 0531  BP: 131/73  Pulse: 94  Temp: 98.4 F (36.9 C)  Resp: 18    Intake/Output Summary (Last 24 hours) at 06/03/14 1014 Last data filed at 06/03/14 0919  Gross per 24 hour  Intake      0 ml  Output      0 ml  Net      0 ml   Filed Weights   06/02/14 0952 06/02/14 1630  Weight: 158.759 kg (350 lb) 158.759 kg (350 lb)    Exam:   General:  A+Ox3, NAD  Cardiovascular: rrr  Respiratory: diminished due to body habitus  Abdomen: +Bs, Soft  Musculoskeletal: no edema  Data Reviewed: Basic Metabolic Panel:  Recent Labs Lab 06/02/14 1027  NA 137  K 4.6  CL 105  CO2 24  GLUCOSE 104*  BUN 6  CREATININE 1.03  CALCIUM 8.7   Liver Function Tests: No results for input(s): AST, ALT, ALKPHOS, BILITOT, PROT, ALBUMIN in the last 168 hours. No results for input(s): LIPASE, AMYLASE in the last 168 hours. No results for input(s): AMMONIA in the last 168 hours. CBC:  Recent Labs Lab 06/02/14 1027 06/03/14 0855  WBC 20.4* 20.2*  NEUTROABS 16.6*  --   HGB 12.6* 12.3*  HCT 37.1* 36.9*  MCV 92.8 92.7  PLT 284 249   Cardiac Enzymes: No results for input(s): CKTOTAL, CKMB, CKMBINDEX, TROPONINI in the last 168 hours. BNP (last 3 results) No results for input(s): BNP in the last 8760 hours.  ProBNP (last 3 results)  Recent Labs  01/24/14 2009  PROBNP 22.7    CBG:  Recent  Labs Lab 06/02/14 1731 06/02/14 1956 06/02/14 2322 06/03/14 0411 06/03/14 0749  GLUCAP 140* 132* 137* 139* 171*    No results found for this or any previous visit (from the past 240 hour(s)).   Studies: Ct Soft Tissue Neck W Contrast  06/02/2014   CLINICAL DATA:  Sore throat  EXAM: CT NECK WITH CONTRAST  TECHNIQUE: Multidetector CT imaging of the neck was performed using the standard protocol following the bolus administration of intravenous contrast.  CONTRAST:  67mL OMNIPAQUE IOHEXOL 300 MG/ML  SOLN  COMPARISON:  CT neck 10/24/2013  FINDINGS: Pharynx and larynx: Diffuse enlargement of the adenoid lymph tissue has improved since the prior study. Bilateral tonsillar enlargement left greater than right has improved in the interval. This causes narrowing of the oropharynx. No abscess. Epiglottis and larynx normal.  Salivary glands: Negative  Thyroid: Limited detail due to large patient size and low technique. Left thyroid goiter extending posteriorly unchanged. This displaces the trachea to the right. The goiter extends into the superior mediastinum on the left and measures approximately 35 mm unchanged.  Lymph nodes: Right level 2 lymph node 15 mm unchanged from the prior study. Posterior node on the right 13 mm unchanged. Left level 2 lymph node 16 mm in diameter unchanged. 15 mm posterior lymph node at the level of the hyoid is  unchanged.  Vascular: Limited evaluation due to patient size.  Limited intracranial: Negative  Visualized orbits: Not imaged  Mastoids and visualized paranasal sinuses: Negative  Skeleton: Limited due to patient size  Upper chest: Lung apices clear  IMPRESSION: Hypertrophy of the adenoid and tonsil lymphoid tissue is present bilaterally. This has improved since the prior CT. No peritonsillar abscess  Cervical adenopathy is similar to the prior CT and most likely reactive adenopathy related to pharyngitis.  Left thyroid goiter extending into the superior mediastinum unchanged  from the prior study.  Limited study due to patient size.   Electronically Signed   By: Franchot Gallo M.D.   On: 06/02/2014 13:31    Scheduled Meds: . ampicillin-sulbactam (UNASYN) IV  1.5 g Intravenous Q6H  . heparin  5,000 Units Subcutaneous 3 times per day  . insulin aspart  0-9 Units Subcutaneous 6 times per day  . ondansetron  4 mg Intravenous Once   Continuous Infusions:  Antibiotics Given (last 72 hours)    Date/Time Action Medication Dose Rate   06/02/14 1825 Given   ampicillin-sulbactam (UNASYN) 1.5 g in sodium chloride 0.9 % 50 mL IVPB 1.5 g 100 mL/hr   06/02/14 2205 Given   ampicillin-sulbactam (UNASYN) 1.5 g in sodium chloride 0.9 % 50 mL IVPB 1.5 g 100 mL/hr   06/03/14 8921 Given   ampicillin-sulbactam (UNASYN) 1.5 g in sodium chloride 0.9 % 50 mL IVPB 1.5 g 100 mL/hr      Active Problems:   Prediabetes   Pharyngitis   Leukocytosis   Obesity   Pharyngitis, acute    Time spent: 25 min    Celesta Funderburk, [place] [facility] Pager [phone]. If 7PM-7AM, please contact night-coverage at [email or web address], password [facility] 06/03/2014, 10:14 AM  LOS: 1 day

## 2014-06-03 NOTE — Progress Notes (Signed)
Utilization review completed.  

## 2014-06-04 DIAGNOSIS — J020 Streptococcal pharyngitis: Secondary | ICD-10-CM

## 2014-06-04 LAB — CULTURE, GROUP A STREP: STREP A CULTURE: POSITIVE — AB

## 2014-06-04 LAB — GLUCOSE, CAPILLARY
Glucose-Capillary: 106 mg/dL — ABNORMAL HIGH (ref 70–99)
Glucose-Capillary: 122 mg/dL — ABNORMAL HIGH (ref 70–99)

## 2014-06-04 LAB — HIV ANTIBODY (ROUTINE TESTING W REFLEX): HIV SCREEN 4TH GENERATION: NONREACTIVE

## 2014-06-04 MED ORDER — AMOXICILLIN-POT CLAVULANATE 875-125 MG PO TABS
1.0000 | ORAL_TABLET | Freq: Two times a day (BID) | ORAL | Status: DC
  Filled 2014-06-04 (×2): qty 1

## 2014-06-04 MED ORDER — AMOXICILLIN-POT CLAVULANATE 875-125 MG PO TABS: 1.0000 | ORAL_TABLET | Freq: Two times a day (BID) | ORAL | Status: DC

## 2014-06-04 NOTE — Discharge Summary (Addendum)
Physician Discharge Summary  CAILAN GENERAL XBM:[id number] DOB: [birth year] DOA: 06/02/2014  PCP: Benito Mccreedy, MD  Admit date: 06/02/2014 Discharge date: 06/04/2014  Time spent: 35 minutes  Recommendations for Outpatient Follow-up:  1. Cbc 2 weeks to ensure resolution of WBC  Discharge Diagnoses:  Active Problems:   Prediabetes   Pharyngitis   Leukocytosis   Obesity   Pharyngitis, acute   Streptococcal sore throat   Discharge Condition: improved  Diet recommendation: cardiac/diabetic  Filed Weights   06/02/14 0272 06/02/14 1630  Weight: 158.759 kg (350 lb) 158.759 kg (350 lb)    History of present illness:  Shaun Stokes is a 49 y.o. male With past medical history past medical history severe pharyngitis back in 9 2015 that comes in Throat pain that started today prior to admission he relates to have progressively gotten worse. He describes pain with swallowing but he also cannot swallow. He's been having fever some chills. Has been nauseated and vomiting.  he denies any sick contacts. He's never been tested for immunodeficiencies.   ED: ENT was consulted That performed laryngoscopy he denies see any abscesses, CT scan of this neck was done-did not show any abscesses so we are consulted for further evaluation. He's been having fevers here in the ED [place] tachycardic and was started on empiric antibiotics  Hospital Course:  Strep +: augmentin PO x 4 more days Patient eating and ready to go home   Procedures:  laryngoscopy  Consultations:  ENT  Discharge Exam: Filed Vitals:   06/04/14 0722  BP: 123/76  Pulse: 98  Temp: 98.7 F (37.1 C)  Resp: 20    General: A+Ox3, NAD Cardiovascular: rrr Respiratory: clear  Discharge Instructions   Discharge Instructions    Diet - low sodium heart healthy    Complete by:  As directed      Diet Carb Modified    Complete by:  As directed      Discharge instructions    Complete by:  As directed   Cbc 2 weeks to  ensure resolution of WBC     Increase activity slowly    Complete by:  As directed           Current Discharge Medication List    START taking these medications   Details  amoxicillin-clavulanate (AUGMENTIN) 875-125 MG per tablet Take 1 tablet by mouth every 12 (twelve) hours. Qty: 8 tablet, Refills: 0      CONTINUE these medications which have NOT CHANGED   Details  acetaminophen (TYLENOL) 500 MG tablet Take 30,000 mg by mouth every 4 (four) hours as needed for mild pain or moderate pain.      STOP taking these medications     azithromycin (ZITHROMAX Z-PAK) 250 MG tablet      oxyCODONE-acetaminophen (PERCOCET) 5-325 MG per tablet      predniSONE (DELTASONE) 10 MG tablet      zolpidem (AMBIEN) 5 MG tablet        No Known Allergies    The results of significant diagnostics from this hospitalization (including imaging, microbiology, ancillary and laboratory) are listed below for reference.    Significant Diagnostic Studies: Ct Soft Tissue Neck W Contrast  06/02/2014   CLINICAL DATA:  Sore throat  EXAM: CT NECK WITH CONTRAST  TECHNIQUE: Multidetector CT imaging of the neck was performed using the standard protocol following the bolus administration of intravenous contrast.  CONTRAST:  38mL OMNIPAQUE IOHEXOL 300 MG/ML  SOLN  COMPARISON:  CT neck 10/24/2013  FINDINGS:  Pharynx and larynx: Diffuse enlargement of the adenoid lymph tissue has improved since the prior study. Bilateral tonsillar enlargement left greater than right has improved in the interval. This causes narrowing of the oropharynx. No abscess. Epiglottis and larynx normal.  Salivary glands: Negative  Thyroid: Limited detail due to large patient size and low technique. Left thyroid goiter extending posteriorly unchanged. This displaces the trachea to the right. The goiter extends into the superior mediastinum on the left and measures approximately 35 mm unchanged.  Lymph nodes: Right level 2 lymph node 15 mm unchanged  from the prior study. Posterior node on the right 13 mm unchanged. Left level 2 lymph node 16 mm in diameter unchanged. 15 mm posterior lymph node at the level of the hyoid is unchanged.  Vascular: Limited evaluation due to patient size.  Limited intracranial: Negative  Visualized orbits: Not imaged  Mastoids and visualized paranasal sinuses: Negative  Skeleton: Limited due to patient size  Upper chest: Lung apices clear  IMPRESSION: Hypertrophy of the adenoid and tonsil lymphoid tissue is present bilaterally. This has improved since the prior CT. No peritonsillar abscess  Cervical adenopathy is similar to the prior CT and most likely reactive adenopathy related to pharyngitis.  Left thyroid goiter extending into the superior mediastinum unchanged from the prior study.  Limited study due to patient size.   Electronically Signed   By: Franchot Gallo M.D.   On: 06/02/2014 13:31    Microbiology: Recent Results (from the past 240 hour(s))  Culture, Group A Strep     Status: Abnormal   Collection Time: 06/02/14  8:51 AM  Result Value Ref Range Status   Strep A Culture Positive (A)  Final    Comment: (NOTE) Penicillin and ampicillin are drugs of choice for treatment of beta-hemolytic streptococcal infections. Susceptibility testing of penicillins and other beta-lactam agents approved by the FDA for treatment of beta-hemolytic streptococcal infections need not be performed routinely because nonsusceptible isolates are extremely rare in any beta-hemolytic streptococcus and have not been reported for Streptococcus pyogenes (group A). (CLSI 2011) Performed At: [facility] [place], [place] [id number] Lindon Romp MD LO:[id number]   Culture, blood (routine x 2)     Status: None (Preliminary result)   Collection Time: 06/02/14  7:08 PM  Result Value Ref Range Status   Specimen Description BLOOD LEFT ANTECUBITAL  Final   Special Requests BOTTLES DRAWN AEROBIC AND ANAEROBIC 10CC   Final   Culture   Final           BLOOD CULTURE RECEIVED NO GROWTH TO DATE CULTURE WILL BE HELD FOR 5 DAYS BEFORE ISSUING A FINAL NEGATIVE REPORT Performed at [facility]    Report Status PENDING  Incomplete  Culture, blood (routine x 2)     Status: None (Preliminary result)   Collection Time: 06/02/14  7:14 PM  Result Value Ref Range Status   Specimen Description BLOOD LEFT HAND  Final   Special Requests BOTTLES DRAWN AEROBIC AND ANAEROBIC 10CC  Final   Culture   Final           BLOOD CULTURE RECEIVED NO GROWTH TO DATE CULTURE WILL BE HELD FOR 5 DAYS BEFORE ISSUING A FINAL NEGATIVE REPORT Performed at [facility]    Report Status PENDING  Incomplete     Labs: Basic Metabolic Panel:  Recent Labs Lab 06/02/14 1027 06/03/14 0855  NA 137 138  K 4.6 4.0  CL 105 105  CO2 24 22  GLUCOSE 104* 143*  BUN 6 13  CREATININE 1.03 0.90  CALCIUM 8.7 8.6   Liver Function Tests: No results for input(s): AST, ALT, ALKPHOS, BILITOT, PROT, ALBUMIN in the last 168 hours. No results for input(s): LIPASE, AMYLASE in the last 168 hours. No results for input(s): AMMONIA in the last 168 hours. CBC:  Recent Labs Lab 06/02/14 1027 06/03/14 0855  WBC 20.4* 20.2*  NEUTROABS 16.6*  --   HGB 12.6* 12.3*  HCT 37.1* 36.9*  MCV 92.8 92.7  PLT 284 249   Cardiac Enzymes: No results for input(s): CKTOTAL, CKMB, CKMBINDEX, TROPONINI in the last 168 hours. BNP: BNP (last 3 results) No results for input(s): BNP in the last 8760 hours.  ProBNP (last 3 results)  Recent Labs  01/24/14 2009  PROBNP 22.7    CBG:  Recent Labs Lab 06/03/14 1154 06/03/14 1600 06/03/14 1956 06/04/14 0032 06/04/14 0415  GLUCAP 170* 126* 142* 122* 106*       Signed:  Alivya Wegman  [facility] 06/04/2014, 10:01 AM

## 2014-06-04 NOTE — Progress Notes (Signed)
Discharge instructions completed with patient. Teach back was successful. 10:53 AM 06/04/2014 Carmela Hurt, RN

## 2014-06-09 LAB — CULTURE, BLOOD (ROUTINE X 2)
CULTURE: NO GROWTH
Culture: NO GROWTH

## 2014-06-29 ENCOUNTER — Ambulatory Visit (INDEPENDENT_AMBULATORY_CARE_PROVIDER_SITE_OTHER): Payer: BLUE CROSS/BLUE SHIELD | Admitting: Pulmonary Disease

## 2014-06-29 ENCOUNTER — Encounter: Payer: Self-pay | Admitting: Pulmonary Disease

## 2014-06-29 VITALS — BP 112/58 | HR 99 | Ht 74.0 in | Wt 352.0 lb

## 2014-06-29 DIAGNOSIS — G4733 Obstructive sleep apnea (adult) (pediatric): Secondary | ICD-10-CM

## 2014-06-29 DIAGNOSIS — G4700 Insomnia, unspecified: Secondary | ICD-10-CM

## 2014-06-29 NOTE — Patient Instructions (Signed)
Will arrange for home sleep study Will call to arrange for follow up after sleep study reviewed  

## 2014-06-29 NOTE — Progress Notes (Signed)
Chief Complaint  Patient presents with  . CONSULT    Referred Dr Vista Lawman. Epworth Score: 1    History of Present Illness: Shaun Stokes is a 49 y.o. male for evaluation of sleep problems.  He was having trouble falling asleep.  He says he could stay awake for up to 3 days straight.  He would then eventually fall asleep, and sleep for about 8 hours.  This isn't as much of an issue since he was started on hydroxyzine.   He has been told he is a loud snorer.  He has also been told he stops breathing while asleep.  His mouth gets dry at night.  He has trouble sleeping on his back, and gets sweaty while sleeping.  He can have trouble falling asleep while watching TV.  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  He also is going to school for a Dealer.  He gets home at about 10 pm.  He takes hydroxyzine at 1030 pm.  He goes to sleep at midnight.  He wakes up sometimes to use the bathroom.  He gets out of bed at 7 am.  He feels okay in the morning.  He denies morning headache.  He does not use anything to help him fall sleep or stay awake.  He denies sleep walking, sleep talking, bruxism, or nightmares.  There is no history of restless legs.  He denies sleep hallucinations, sleep paralysis, or cataplexy.  The Epworth score is 1 out of 24.  He had CT neck from 06/02/14 which showed tonsillar hypertrophy and Lt thyroid goiter with extension to superior mediastinum.  Shaun Stokes  has a past medical history of Insomnia; Obesity; Prediabetes; Hyperlipemia; Vitamin D deficiency; Chest pain; and Goiter.  Shaun Stokes  has past surgical history that includes No past surgeries.  Prior to Admission medications   Medication Sig Start Date End Date Taking? Authorizing Provider  acetaminophen (TYLENOL) 500 MG tablet Take 30,000 mg by mouth every 4 (four) hours as needed for mild pain or moderate pain.   Yes Historical Provider, MD  citalopram (CELEXA) 40 MG tablet Take  40 mg by mouth daily.   Yes Historical Provider, MD  hydrOXYzine (ATARAX/VISTARIL) 25 MG tablet Take 1 tablet by mouth at bedtime as needed. 04/20/14  Yes Historical Provider, MD  losartan (COZAAR) 25 MG tablet Take 1 tablet by mouth daily. 05/11/14  Yes Historical Provider, MD    No Known Allergies  His family history includes Heart attack in his father.  He  reports that he has been smoking Cigarettes.  He has a 16 pack-year smoking history. He has never used smokeless tobacco. He reports that he drinks about 1.8 oz of alcohol per week. He reports that he does not use illicit drugs.  Review of Systems  Constitutional: Negative for fever and unexpected weight change.  HENT: Positive for sore throat. Negative for congestion, dental problem, ear pain, nosebleeds, postnasal drip, rhinorrhea, sinus pressure, sneezing and trouble swallowing.   Eyes: Negative for redness and itching.  Respiratory: Positive for shortness of breath. Negative for cough, chest tightness and wheezing.   Cardiovascular: Negative for palpitations and leg swelling.  Gastrointestinal: Negative for nausea and vomiting.  Genitourinary: Negative for dysuria.  Musculoskeletal: Positive for joint swelling.  Skin: Negative for rash.  Neurological: Negative for headaches.  Hematological: Does not bruise/bleed easily.  Psychiatric/Behavioral: Negative for dysphoric mood. The patient is not nervous/anxious.    Physical Exam: Blood pressure  112/58, pulse 99, height 6\' 2"  (1.88 m), weight 352 lb (159.666 kg), SpO2 95 %. Body mass index is 45.17 kg/(m^2).  General - No distress ENT - No sinus tenderness, no oral exudate, no LAN, no thyromegaly, TM clear, pupils equal/reactive, MP 4, enlarged tongue, scalloped tongue border Cardiac - s1s2 regular, no murmur, pulses symmetric Chest - No wheeze/rales/dullness, good air entry, normal respiratory excursion Back - No focal tenderness Abd - Soft, non-tender, no organomegaly, + bowel  sounds Ext - No edema Neuro - Normal strength, cranial nerves intact Skin - No rashes Psych - Normal mood, and behavior  Discussion: He has snoring, sleep disruption, witnessed apnea and daytime sleepiness.  He has hx of HTN and pre diabetes.  His BMI is > 35.  I am concerned he could have sleep apnea.  We discussed how sleep apnea can affect various health problems including risks for hypertension, cardiovascular disease, and diabetes.  We also discussed how sleep disruption can increase risks for accident, such as while driving.  Weight loss as a means of improving sleep apnea was also reviewed.  Additional treatment options discussed were CPAP therapy, oral appliance, and surgical intervention.  He also reports symptoms of insomnia that have improved since starting hydroxyzine, but I am concerned his insomnia symptoms might actually be related to awakenings from sleep apnea.  Assessment/plan:  Obstructive sleep apnea. Plan: - will arrange for home sleep study pending insurance approval  Insomnia Plan: - continue hydroxyzine for now - re-assess after review of his sleep study  Obesity. Plan: - discussed how his weight is affecting his sleep and health  Tobacco abuse. Plan: - discussed importance of smoking cessation and encouraged his efforts to continue with this  Goiter. Plan: - f/u with PCP   Chesley Mires, M.D. Pager [phone]

## 2014-06-29 NOTE — Progress Notes (Deleted)
   Subjective:    Patient ID: Shaun Stokes, male    DOB: [birth year], 49 y.o.   MRN: [id number]  HPI    Review of Systems  Constitutional: Negative for fever and unexpected weight change.  HENT: Positive for sore throat. Negative for congestion, dental problem, ear pain, nosebleeds, postnasal drip, rhinorrhea, sinus pressure, sneezing and trouble swallowing.   Eyes: Negative for redness and itching.  Respiratory: Positive for shortness of breath. Negative for cough, chest tightness and wheezing.   Cardiovascular: Negative for palpitations and leg swelling.  Gastrointestinal: Negative for nausea and vomiting.  Genitourinary: Negative for dysuria.  Musculoskeletal: Positive for joint swelling.  Skin: Negative for rash.  Neurological: Negative for headaches.  Hematological: Does not bruise/bleed easily.  Psychiatric/Behavioral: Negative for dysphoric mood. The patient is not nervous/anxious.        Objective:   Physical Exam        Assessment & Plan:

## 2014-08-09 ENCOUNTER — Telehealth: Payer: Self-pay | Admitting: Pulmonary Disease

## 2014-08-09 NOTE — Telephone Encounter (Signed)
Who has this order ?

## 2014-08-09 NOTE — Telephone Encounter (Signed)
Pt returning call pt said it is ok to leave a message.Shaun Stokes

## 2014-08-09 NOTE — Telephone Encounter (Signed)
Attempted to reach patient again today at 4:52 pm. No answer. Left another message. Rhonda J Cobb

## 2014-08-09 NOTE — Telephone Encounter (Signed)
HST order 5/17. Please advise PCC's thanks

## 2014-08-09 NOTE — Telephone Encounter (Signed)
I have the order and have left multiple messages on patient's voice mail. Messages left on 07/20/14, 07/23/14, 07/28/14, 08/06/14 and then I attempted again today 08/09/14 with no answer. I have left my call back number and I have not received a return call back from the patient on either message. Rhonda J Cobb

## 2014-08-10 NOTE — Telephone Encounter (Signed)
Attempted to reach patient again this morning at 9:37 am. No answer, went to VM. Left a detailed message that we have been trying to arrange this study for 3 wks now. Please give [place] a call at [phone] or the main number [phone] and ask to be transferred to one of the patient care coordinators to arrange this study. Rhonda J Cobb

## 2014-08-10 NOTE — Telephone Encounter (Signed)
Pt returned call and is scheduled to p/u HST device on Thurs 08/19/14. Pt is aware to come to our office on 08/19/14 between 2:30-4:30 pm and ask for Suanne Marker or Rodena Piety. Nothing else needed at this time. Rhonda J Cobb

## 2014-08-20 DIAGNOSIS — G4730 Sleep apnea, unspecified: Secondary | ICD-10-CM | POA: Diagnosis not present

## 2014-08-24 ENCOUNTER — Other Ambulatory Visit: Payer: Self-pay | Admitting: *Deleted

## 2014-08-24 ENCOUNTER — Encounter: Payer: Self-pay | Admitting: Pulmonary Disease

## 2014-08-24 ENCOUNTER — Telehealth: Payer: Self-pay | Admitting: Pulmonary Disease

## 2014-08-24 DIAGNOSIS — G4733 Obstructive sleep apnea (adult) (pediatric): Secondary | ICD-10-CM

## 2014-08-24 DIAGNOSIS — G4730 Sleep apnea, unspecified: Secondary | ICD-10-CM | POA: Diagnosis not present

## 2014-08-24 HISTORY — DX: Obstructive sleep apnea (adult) (pediatric): G47.33

## 2014-08-24 NOTE — Telephone Encounter (Signed)
HST 08/20/14 >> AHI 61.1, SaO2 low 61%.  Will have my nurse inform pt that sleep study shows severe sleep apnea.  Options are 1) CPAP now, 2) ROV first.  If He is agreeable to CPAP, then please send order for auto CPAP range 5 to 15 cm H2O with heated humidity and mask of choice.  Have download sent 1 month after starting CPAP and set up ROV 2 months after starting CPAP.

## 2014-08-25 NOTE — Telephone Encounter (Signed)
LMTCB x 1 

## 2014-08-31 NOTE — Telephone Encounter (Signed)
LMTCB x2  

## 2014-09-02 NOTE — Telephone Encounter (Signed)
Pt aware of HST results and recs.  Pt wishes to proceed with cpap order.  Order has been placed.  Nothing further needed at this time.

## 2014-09-02 NOTE — Telephone Encounter (Signed)
LVM for pt to return call

## 2015-04-11 ENCOUNTER — Emergency Department (HOSPITAL_COMMUNITY)
Admission: EM | Admit: 2015-04-11 | Discharge: 2015-04-12 | Disposition: A | Discharge status: Home / Self Care | Payer: BLUE CROSS/BLUE SHIELD | Attending: Emergency Medicine | Admitting: Emergency Medicine

## 2015-04-11 ENCOUNTER — Emergency Department (HOSPITAL_COMMUNITY): Payer: BLUE CROSS/BLUE SHIELD

## 2015-04-11 ENCOUNTER — Encounter (HOSPITAL_COMMUNITY): Payer: Self-pay | Admitting: Emergency Medicine

## 2015-04-11 DIAGNOSIS — Z8669 Personal history of other diseases of the nervous system and sense organs: Secondary | ICD-10-CM | POA: Diagnosis not present

## 2015-04-11 DIAGNOSIS — Z87891 Personal history of nicotine dependence: Secondary | ICD-10-CM | POA: Diagnosis not present

## 2015-04-11 DIAGNOSIS — R600 Localized edema: Secondary | ICD-10-CM | POA: Insufficient documentation

## 2015-04-11 DIAGNOSIS — E669 Obesity, unspecified: Secondary | ICD-10-CM | POA: Diagnosis not present

## 2015-04-11 DIAGNOSIS — R002 Palpitations: Secondary | ICD-10-CM | POA: Insufficient documentation

## 2015-04-11 DIAGNOSIS — R609 Edema, unspecified: Secondary | ICD-10-CM

## 2015-04-11 DIAGNOSIS — Z79899 Other long term (current) drug therapy: Secondary | ICD-10-CM | POA: Diagnosis not present

## 2015-04-11 DIAGNOSIS — R0602 Shortness of breath: Secondary | ICD-10-CM | POA: Diagnosis not present

## 2015-04-11 LAB — BASIC METABOLIC PANEL
ANION GAP: 12 (ref 5–15)
BUN: 13 mg/dL (ref 6–20)
CHLORIDE: 105 mmol/L (ref 101–111)
CO2: 23 mmol/L (ref 22–32)
Calcium: 9.4 mg/dL (ref 8.9–10.3)
Creatinine, Ser: 1.07 mg/dL (ref 0.61–1.24)
GFR calc non Af Amer: >60 mL/min (ref >60)
Glucose, Bld: 89 mg/dL (ref 65–99)
Potassium: 4.2 mmol/L (ref 3.5–5.1)
SODIUM: 140 mmol/L (ref 135–145)

## 2015-04-11 LAB — CBC
HCT: 40.8 % (ref 39.0–52.0)
HEMOGLOBIN: 14.1 g/dL (ref 13.0–17.0)
MCH: 31.8 pg (ref 26.0–34.0)
MCHC: 34.6 g/dL (ref 30.0–36.0)
MCV: 91.9 fL (ref 78.0–100.0)
Platelets: 254 10*3/uL (ref 150–400)
RBC: 4.44 MIL/uL (ref 4.22–5.81)
RDW: 15.2 % (ref 11.5–15.5)
WBC: 11.3 10*3/uL — AB (ref 4.0–10.5)

## 2015-04-11 LAB — I-STAT TROPONIN, ED
TROPONIN I, POC: 0.01 ng/mL (ref 0.00–0.08)
TROPONIN I, POC: 0.01 ng/mL (ref 0.00–0.08)

## 2015-04-11 LAB — TSH: TSH: 1.32 u[IU]/mL (ref 0.350–4.500)

## 2015-04-11 LAB — BRAIN NATRIURETIC PEPTIDE: B NATRIURETIC PEPTIDE 5: 29.5 pg/mL (ref 0.0–100.0)

## 2015-04-11 LAB — T4, FREE: Free T4: 0.95 ng/dL (ref 0.61–1.12)

## 2015-04-11 MED ORDER — LOSARTAN POTASSIUM 25 MG PO TABS
25.0000 mg | ORAL_TABLET | Freq: Once | ORAL | Status: AC
Start: 2015-04-11 — End: 2015-04-11
  Administered 2015-04-11: 25 mg via ORAL
  Filled 2015-04-11: qty 1

## 2015-04-11 NOTE — Discharge Instructions (Signed)
Peripheral Edema You have swelling in your legs (peripheral edema). This swelling is due to excess accumulation of salt and water in your body. Edema may be a sign of heart, kidney or liver disease, or a side effect of a medication. It may also be due to problems in the leg veins. Elevating your legs and using special support stockings may be very helpful, if the cause of the swelling is due to poor venous circulation. Avoid long periods of standing, whatever the cause. Treatment of edema depends on identifying the cause. Chips, pretzels, pickles and other salty foods should be avoided. Restricting salt in your diet is almost always needed. Water pills (diuretics) are often used to remove the excess salt and water from your body via urine. These medicines prevent the kidney from reabsorbing sodium. This increases urine flow. Diuretic treatment may also result in lowering of potassium levels in your body. Potassium supplements may be needed if you have to use diuretics daily. Daily weights can help you keep track of your progress in clearing your edema. You should call your caregiver for follow up care as recommended. SEEK IMMEDIATE MEDICAL CARE IF:   You have increased swelling, pain, redness, or heat in your legs.  You develop shortness of breath, especially when lying down.  You develop chest or abdominal pain, weakness, or fainting.  You have a fever.   This information is not intended to replace advice given to you by your health care provider. Make sure you discuss any questions you have with your health care provider.   Document Released: 03/08/2004 Document Revised: 04/23/2011 Document Reviewed: 08/11/2014 Elsevier Interactive Patient Education 2016 [employer].  Palpitations A palpitation is the feeling that your heartbeat is irregular or is faster than normal. It may feel like your heart is fluttering or skipping a beat. Palpitations are usually not a serious problem. However, in some  cases, you may need further medical evaluation. CAUSES  Palpitations can be caused by:  Smoking.  Caffeine or other stimulants, such as diet pills or energy drinks.  Alcohol.  Stress and anxiety.  Strenuous physical activity.  Fatigue.  Certain medicines.  Heart disease, especially if you have a history of irregular heart rhythms (arrhythmias), such as atrial fibrillation, atrial flutter, or supraventricular tachycardia.  An improperly working pacemaker or defibrillator. DIAGNOSIS  To find the cause of your palpitations, your health care provider will take your medical history and perform a physical exam. Your health care provider may also have you take a test called an ambulatory electrocardiogram (ECG). An ECG records your heartbeat patterns over a 24-hour period. You may also have other tests, such as:  Transthoracic echocardiogram (TTE). During echocardiography, sound waves are used to evaluate how blood flows through your heart.  Transesophageal echocardiogram (TEE).  Cardiac monitoring. This allows your health care provider to monitor your heart rate and rhythm in real time.  Holter monitor. This is a portable device that records your heartbeat and can help diagnose heart arrhythmias. It allows your health care provider to track your heart activity for several days, if needed.  Stress tests by exercise or by giving medicine that makes the heart beat faster. TREATMENT  Treatment of palpitations depends on the cause of your symptoms and can vary greatly. Most cases of palpitations do not require any treatment other than time, relaxation, and monitoring your symptoms. Other causes, such as atrial fibrillation, atrial flutter, or supraventricular tachycardia, usually require further treatment. HOME CARE INSTRUCTIONS   Avoid:  Caffeinated  coffee, tea, soft drinks, diet pills, and energy drinks.  Chocolate.  Alcohol.  Stop smoking if you smoke.  Reduce your stress and  anxiety. Things that can help you relax include:  A method of controlling things in your body, such as your heartbeats, with your mind (biofeedback).  Yoga.  Meditation.  Physical activity such as swimming, jogging, or walking.  Get plenty of rest and sleep. SEEK MEDICAL CARE IF:   You continue to have a fast or irregular heartbeat beyond 24 hours.  Your palpitations occur more often. SEEK IMMEDIATE MEDICAL CARE IF:  You have chest pain or shortness of breath.  You have a severe headache.  You feel dizzy or you faint. MAKE SURE YOU:  Understand these instructions.  Will watch your condition.  Will get help right away if you are not doing well or get worse.   This information is not intended to replace advice given to you by your health care provider. Make sure you discuss any questions you have with your health care provider.   Document Released: 01/27/2000 Document Revised: 02/03/2013 Document Reviewed: 03/30/2011 Elsevier Interactive Patient Education [employer].

## 2015-04-11 NOTE — ED Notes (Signed)
Pt here c/o palpitations starting today

## 2015-04-11 NOTE — ED Provider Notes (Signed)
CSN: [id number]     Arrival date & time 04/11/15  1510 History   First MD Initiated Contact with Patient 04/11/15 2001     Chief Complaint  Patient presents with  . Palpitations     (Consider location/radiation/quality/duration/timing/severity/associated sxs/prior Treatment) Patient is a 50 y.o. male presenting with palpitations.  Palpitations Palpitations quality:  Regular Onset quality:  Sudden Duration:  12 hours Timing:  Constant Progression:  Waxing and waning Chronicity:  New Context: not anxiety, not dehydration, not hyperventilation and not nicotine   Relieved by: being still. Exacerbated by: walking. Ineffective treatments:  None tried Associated symptoms: lower extremity edema (for 1 month) and shortness of breath   Associated symptoms: no back pain, no chest pain, no cough, no diaphoresis, no dizziness, no leg pain, no nausea, no vomiting and no weakness   Risk factors: hx of thyroid disease (goiter)   Risk factors: no diabetes mellitus, no hx of atrial fibrillation, no OTC sinus medications and no stress    Patient has a history of hypertension and reports being off his hypertensive medication for the past month as he has not been able to afford it.  Past Medical History  Diagnosis Date  . Insomnia   . Obesity   . Prediabetes   . Hyperlipemia   . Vitamin D deficiency   . Chest pain   . Goiter   . OSA (obstructive sleep apnea) 08/24/2014   Past Surgical History  Procedure Laterality Date  . No past surgeries     Family History  Problem Relation Age of Onset  . Heart attack Father    Social History  Substance Use Topics  . Smoking status: Former Smoker -- 0.50 packs/day for 32 years    Types: Cigarettes  . Smokeless tobacco: Never Used     Comment: 6-10 cigs daily (06/29/14)  . Alcohol Use: 1.8 oz/week    3 Shots of liquor, 0 Standard drinks or equivalent per week     Comment: 2-3 drinks twice week    Review of Systems  Constitutional: Negative for  fever, chills, diaphoresis, appetite change and fatigue.  HENT: Negative for congestion, ear pain, facial swelling, mouth sores and sore throat.   Eyes: Negative for visual disturbance.  Respiratory: Positive for shortness of breath. Negative for cough and chest tightness.   Cardiovascular: Positive for palpitations. Negative for chest pain.  Gastrointestinal: Negative for nausea, vomiting, abdominal pain, diarrhea and blood in stool.  Endocrine: Negative for cold intolerance and heat intolerance.  Genitourinary: Negative for frequency, decreased urine volume and difficulty urinating.  Musculoskeletal: Negative for back pain and neck stiffness.  Skin: Negative for rash.  Neurological: Negative for dizziness, weakness, light-headedness and headaches.  All other systems reviewed and are negative.     Allergies  Review of patient's allergies indicates no known allergies.  Home Medications   Prior to Admission medications   Medication Sig Start Date End Date Taking? Authorizing Provider  acetaminophen (TYLENOL) 500 MG tablet Take 30,000 mg by mouth every 4 (four) hours as needed for mild pain or moderate pain.   Yes Historical Provider, MD  citalopram (CELEXA) 40 MG tablet Take 40 mg by mouth daily.   Yes Historical Provider, MD  hydrOXYzine (ATARAX/VISTARIL) 25 MG tablet Take 25 mg by mouth at bedtime.  04/20/14  Yes Historical Provider, MD  losartan (COZAAR) 25 MG tablet Take 25 mg by mouth daily.  05/11/14  Yes Historical Provider, MD   BP 146/78 mmHg  Pulse 102  Temp(Src)  98.6 F (37 C) (Oral)  Resp 14  SpO2 82% Physical Exam  Constitutional: He is oriented to person, place, and time. He appears well-nourished. No distress.  HENT:  Head: Normocephalic and atraumatic.  Right Ear: External ear normal.  Left Ear: External ear normal.  Eyes: Pupils are equal, round, and reactive to light. Right eye exhibits no discharge. Left eye exhibits no discharge. No scleral icterus.  Neck:  Normal range of motion. Neck supple.  Cardiovascular: Normal rate.  Exam reveals no gallop and no friction rub.   No murmur heard. Pulmonary/Chest: Effort normal and breath sounds normal. No stridor. No respiratory distress. He has no wheezes. He has no rales. He exhibits no tenderness.  Abdominal: Soft. He exhibits no distension and no mass. There is no tenderness. There is no rebound and no guarding.  Musculoskeletal: He exhibits no edema or tenderness.  Neurological: He is alert and oriented to person, place, and time.  Skin: Skin is warm and dry. No rash noted. He is not diaphoretic. No erythema.    ED Course  Procedures (including critical care time) Labs Review Labs Reviewed  CBC - Abnormal; Notable for the following:    WBC 11.3 (*)    All other components within normal limits  BASIC METABOLIC PANEL  BRAIN NATRIURETIC PEPTIDE  T4, FREE  TSH  I-STAT TROPOININ, ED  Randolm Idol, ED    Imaging Review Dg Chest 2 View  04/11/2015  CLINICAL DATA:  Heart palpitations today, ex-smoker. No other chest complaints. EXAM: CHEST  2 VIEW COMPARISON:  01/24/2014 FINDINGS: The heart size and mediastinal contours are within normal limits. Both lungs are clear. No pleural effusion or pneumothorax. The visualized skeletal structures are unremarkable. IMPRESSION: No active cardiopulmonary disease. Electronically Signed   By: Lajean Manes M.D.   On: 04/11/2015 16:55   I have personally reviewed and evaluated these images and lab results as part of my medical decision-making.   EKG Interpretation   Date/Time:  Monday April 11 2015 15:28:29 EST Ventricular Rate:  113 PR Interval:  174 QRS Duration: 70 QT Interval:  324 QTC Calculation: 444 R Axis:   4 Text Interpretation:  Sinus tachycardia Septal infarct , age undetermined  Abnormal ECG since last tracing no significant change Confirmed by MILLER   MD, Ruso ([id number]) on 04/11/2015 6:34:40 PM      MDM   51 year old male  presents with 1 day of palpitations and shortness of breath. He denies any chest pain, dizziness. History as above.   Patient is afebrile and hypertensive. He is well-appearing however has evidence of volume overload. Patient denies known history of congestive heart failure and there is no echocardiogram in our system or care everywhere. The patient didn't did state that he has had an ultrasound of his heart was performed by his doctor but does not know what it showed.   EKG with normal sinus tachycardia, normal intervals and axis. No evidence of acute ischemia or arrhythmia or blocks. Similar to prior EKGs. Chest x-ray without evidence of pneumonia, pulmonary nodules, or pulmonary edema. Initial troponin negative. Repeat troponin 6 hours later was also negative. CBC with mild leukocytosis otherwise nonspecific. BMP without electrolyte derangements or evidence of renal insufficiency. BNP within normal limits. Thyroid panel ordered given the patient's history of goiter. These were within normal limits.  Patient was given his home dose of losartan for his blood pressure, which improved.  He reports that he would be able to get close follow-up with his primary  care provider within 2-3 days for further evaluation. Given his unremarkable workup and ability for close follow-up, we feel that he is appropriate for discharge. He is given strict return precautions.  Diagnostic studies interpreted by me and use to my clinical decision-making. Next  Patient seen in conjunction with Dr. Ashok Cordia.   Final diagnoses:  Palpitations  SOB (shortness of breath)  Pitting edema        Addison Lank, MD 04/11/15 [place], MD 04/12/15 [id number]

## 2015-04-11 NOTE — ED Notes (Signed)
EDP at bedside  

## 2015-04-12 MED ORDER — LOSARTAN POTASSIUM 25 MG PO TABS: 25.0000 mg | ORAL_TABLET | Freq: Every day | ORAL | Status: DC

## 2015-04-15 ENCOUNTER — Emergency Department (HOSPITAL_COMMUNITY)
Admission: EM | Admit: 2015-04-15 | Discharge: 2015-04-15 | Disposition: A | Discharge status: Home / Self Care | Payer: BLUE CROSS/BLUE SHIELD | Attending: Emergency Medicine | Admitting: Emergency Medicine

## 2015-04-15 ENCOUNTER — Encounter (HOSPITAL_COMMUNITY): Payer: Self-pay | Admitting: Emergency Medicine

## 2015-04-15 ENCOUNTER — Emergency Department (INDEPENDENT_AMBULATORY_CARE_PROVIDER_SITE_OTHER)
Admission: EM | Admit: 2015-04-15 | Discharge: 2015-04-15 | Disposition: A | Discharge status: Other Acute Inpatient Hospital | Payer: BLUE CROSS/BLUE SHIELD | Source: Home / Self Care | Attending: Family Medicine | Admitting: Family Medicine

## 2015-04-15 DIAGNOSIS — R002 Palpitations: Secondary | ICD-10-CM

## 2015-04-15 DIAGNOSIS — R42 Dizziness and giddiness: Secondary | ICD-10-CM | POA: Insufficient documentation

## 2015-04-15 DIAGNOSIS — Z8669 Personal history of other diseases of the nervous system and sense organs: Secondary | ICD-10-CM | POA: Insufficient documentation

## 2015-04-15 DIAGNOSIS — R000 Tachycardia, unspecified: Secondary | ICD-10-CM | POA: Diagnosis not present

## 2015-04-15 DIAGNOSIS — Z79899 Other long term (current) drug therapy: Secondary | ICD-10-CM | POA: Diagnosis not present

## 2015-04-15 DIAGNOSIS — Z87891 Personal history of nicotine dependence: Secondary | ICD-10-CM | POA: Diagnosis not present

## 2015-04-15 DIAGNOSIS — E669 Obesity, unspecified: Secondary | ICD-10-CM | POA: Insufficient documentation

## 2015-04-15 LAB — I-STAT TROPONIN, ED
Troponin i, poc: 0 ng/mL (ref 0.00–0.08)
Troponin i, poc: 0 ng/mL (ref 0.00–0.08)

## 2015-04-15 LAB — I-STAT CHEM 8, ED
BUN: 15 mg/dL (ref 6–20)
CALCIUM ION: 1.1 mmol/L — AB (ref 1.12–1.23)
CHLORIDE: 104 mmol/L (ref 101–111)
CREATININE: 1.2 mg/dL (ref 0.61–1.24)
GLUCOSE: 114 mg/dL — AB (ref 65–99)
HCT: 43 % (ref 39.0–52.0)
Hemoglobin: 14.6 g/dL (ref 13.0–17.0)
POTASSIUM: 4.2 mmol/L (ref 3.5–5.1)
Sodium: 140 mmol/L (ref 135–145)
TCO2: 25 mmol/L (ref 0–100)

## 2015-04-15 NOTE — ED Notes (Signed)
[facility] transfer via [facility], c/o SOB, is hypertensive, EKG  Unremarkable, A/O X4, ambulatory and in NAD  20g Right hand

## 2015-04-15 NOTE — ED Notes (Signed)
Pt reports feeling dizzy onset x2 hours associated w/rapid HR of 120 that was checked at [facility], SOB Denies CP, n/v/, HA, weakness Reports he was seen at [facility] on 2/27 for similar sx and was started on Losartan A&O x4... No acute distress.

## 2015-04-15 NOTE — ED Provider Notes (Signed)
CSN: [id number]     Arrival date & time 04/15/15  1648 History   First MD Initiated Contact with Patient 04/15/15 1652     Chief Complaint  Patient presents with  . Hypertension     (Consider location/radiation/quality/duration/timing/severity/associated sxs/prior Treatment) HPI Complains of dizziness i.e. lightheadedness onset 2 hours prior to coming here. Symptoms accompanied by feeling of racing heartbeat. He denied shortness of breath denied chest pain denied headache no other associated symptoms. Evaluated at [facility] urgent care center prior to coming here. Sent here for further evaluation. Patient was seen in the emergency department 04/11/2015 or similar complaint. Started on losartan, with which she's been compliant. He is presently asymptomatic. Last took losartan 9 AM today Past Medical History  Diagnosis Date  . Insomnia   . Obesity   . Prediabetes   . Hyperlipemia   . Vitamin D deficiency   . Chest pain   . Goiter   . OSA (obstructive sleep apnea) 08/24/2014   Past Surgical History  Procedure Laterality Date  . No past surgeries     Family History  Problem Relation Age of Onset  . Heart attack Father    Social History  Substance Use Topics  . Smoking status: Former Smoker -- 0.50 packs/day for 32 years    Types: Cigarettes  . Smokeless tobacco: Never Used     Comment: 6-10 cigs daily (06/29/14)  . Alcohol Use: 1.8 oz/week    3 Shots of liquor, 0 Standard drinks or equivalent per week     Comment: 2-3 drinks twice week    Review of Systems  Constitutional: Negative.   HENT: Negative.   Respiratory: Negative.   Cardiovascular: Positive for palpitations.  Gastrointestinal: Negative.   Musculoskeletal: Negative.   Skin: Negative.   Neurological: Positive for light-headedness.  Psychiatric/Behavioral: Negative.   All other systems reviewed and are negative.     Allergies  Review of patient's allergies indicates no known allergies.  Home Medications    Prior to Admission medications   Medication Sig Start Date End Date Taking? Authorizing Provider  acetaminophen (TYLENOL) 500 MG tablet Take 30,000 mg by mouth every 4 (four) hours as needed for mild pain or moderate pain.    Historical Provider, MD  citalopram (CELEXA) 40 MG tablet Take 40 mg by mouth daily.    Historical Provider, MD  hydrOXYzine (ATARAX/VISTARIL) 25 MG tablet Take 25 mg by mouth at bedtime.  04/20/14   Historical Provider, MD  losartan (COZAAR) 25 MG tablet Take 1 tablet (25 mg total) by mouth daily. 04/12/15   Addison Lank, MD   There were no vitals taken for this visit. Physical Exam  Constitutional: He appears well-developed and well-nourished. No distress.  HENT:  Head: Normocephalic and atraumatic.  Eyes: Conjunctivae are normal. Pupils are equal, round, and reactive to light.  Neck: Neck supple. No tracheal deviation present. No thyromegaly present.  Cardiovascular: Regular rhythm.   No murmur heard. Mildly tachycardic  Pulmonary/Chest: Effort normal and breath sounds normal.  Abdominal: Soft. Bowel sounds are normal. He exhibits no distension. There is no tenderness.  Obese  Musculoskeletal: Normal range of motion. He exhibits no edema or tenderness.  Neurological: He is alert. Coordination normal.  Skin: Skin is warm and dry. No rash noted.  Psychiatric: He has a normal mood and affect.  Nursing note and vitals reviewed.   ED Course  Procedures (including critical care time) Labs Review Labs Reviewed - No data to display  Imaging Review No results  found. I have personally reviewed and evaluated these images and lab results as part of my medical decision-making.   EKG Interpretation None     ED ECG REPORT   Date: 04/15/2015  Rate: 105  Rhythm: sinus tachycardia  QRS Axis: normal  Intervals: normal  ST/T Wave abnormalities: nonspecific T wave changes  Conduction Disutrbances:none  Narrative Interpretation:   Old EKG Reviewed:  unchanged PVCs seen on tracing from 32 8 PM today have resolved. Tracing is unchanged from 04/11/2015  I have personally reviewed the EKG tracing and agree with the computerized printout as noted.   8:40 PM patient asymptomatic alert Glasgow Coma Score 15. Results for orders placed or performed during the hospital encounter of 04/15/15  I-stat chem 8, ed  Result Value Ref Range   Sodium 140 135 - 145 mmol/L   Potassium 4.2 3.5 - 5.1 mmol/L   Chloride 104 101 - 111 mmol/L   BUN 15 6 - 20 mg/dL   Creatinine, Ser 1.20 0.61 - 1.24 mg/dL   Glucose, Bld 114 (H) 65 - 99 mg/dL   Calcium, Ion 1.10 (L) 1.12 - 1.23 mmol/L   TCO2 25 0 - 100 mmol/L   Hemoglobin 14.6 13.0 - 17.0 g/dL   HCT 43.0 39.0 - 52.0 %  I-stat troponin, ED  Result Value Ref Range   Troponin i, poc 0.00 0.00 - 0.08 ng/mL   Comment 3          I-stat troponin, ED  Result Value Ref Range   Troponin i, poc 0.00 0.00 - 0.08 ng/mL   Comment 3           Dg Chest 2 View  04/11/2015  CLINICAL DATA:  Heart palpitations today, ex-smoker. No other chest complaints. EXAM: CHEST  2 VIEW COMPARISON:  01/24/2014 FINDINGS: The heart size and mediastinal contours are within normal limits. Both lungs are clear. No pleural effusion or pneumothorax. The visualized skeletal structures are unremarkable. IMPRESSION: No active cardiopulmonary disease. Electronically Signed   By: Lajean Manes M.D.   On: 04/11/2015 16:55    MDM  PlanEncouraged scheduled to follow-up with primary care physician Dr.Osei bonsu next week Final diagnoses:  None  Diagnosis #1 dizziness #2 palpitations #3 elevated blood pressure      Orlie Dakin, MD 04/15/15 2052

## 2015-04-15 NOTE — Discharge Instructions (Signed)
Dizziness Call Dr.Osei-bonsu's office in 2 days to schedule an appointment for within the next 1 or 2 weeks. Asked him to recheck your blood pressure. Today's was mildly elevated at 156/93. Return if you feel worse for any reason or our concerned. Dizziness is a common problem. It is a feeling of unsteadiness or light-headedness. You may feel like you are about to faint. Dizziness can lead to injury if you stumble or fall. Anyone can become dizzy, but dizziness is more common in older adults. This condition can be caused by a number of things, including medicines, dehydration, or illness. HOME CARE INSTRUCTIONS Taking these steps may help with your condition: Eating and Drinking  Drink enough fluid to keep your urine clear or pale yellow. This helps to keep you from becoming dehydrated. Try to drink more clear fluids, such as water.  Do not drink alcohol.  Limit your caffeine intake if directed by your health care provider.  Limit your salt intake if directed by your health care provider. Activity  Avoid making quick movements.  Rise slowly from chairs and steady yourself until you feel okay.  In the morning, first sit up on the side of the bed. When you feel okay, stand slowly while you hold onto something until you know that your balance is fine.  Move your legs often if you need to stand in one place for a long time. Tighten and relax your muscles in your legs while you are standing.  Do not drive or operate heavy machinery if you feel dizzy.  Avoid bending down if you feel dizzy. Place items in your home so that they are easy for you to reach without leaning over. Lifestyle  Do not use any tobacco products, including cigarettes, chewing tobacco, or electronic cigarettes. If you need help quitting, ask your health care provider.  Try to reduce your stress level, such as with yoga or meditation. Talk with your health care provider if you need help. General Instructions  Watch your  dizziness for any changes.  Take medicines only as directed by your health care provider. Talk with your health care provider if you think that your dizziness is caused by a medicine that you are taking.  Tell a friend or a family member that you are feeling dizzy. If he or she notices any changes in your behavior, have this person call your health care provider.  Keep all follow-up visits as directed by your health care provider. This is important. SEEK MEDICAL CARE IF:  Your dizziness does not go away.  Your dizziness or light-headedness gets worse.  You feel nauseous.  You have reduced hearing.  You have new symptoms.  You are unsteady on your feet or you feel like the room is spinning. SEEK IMMEDIATE MEDICAL CARE IF:  You vomit or have diarrhea and are unable to eat or drink anything.  You have problems talking, walking, swallowing, or using your arms, hands, or legs.  You feel generally weak.  You are not thinking clearly or you have trouble forming sentences. It may take a friend or family member to notice this.  You have chest pain, abdominal pain, shortness of breath, or sweating.  Your vision changes.  You notice any bleeding.  You have a headache.  You have neck pain or a stiff neck.  You have a fever.   This information is not intended to replace advice given to you by your health care provider. Make sure you discuss any questions you have  with your health care provider.   Document Released: 07/25/2000 Document Revised: 06/15/2014 Document Reviewed: 01/25/2014 Elsevier Interactive Patient Education [employer].

## 2015-04-25 ENCOUNTER — Institutional Professional Consult (permissible substitution): Payer: Self-pay | Admitting: Internal Medicine

## 2015-04-25 ENCOUNTER — Encounter (HOSPITAL_COMMUNITY): Payer: Self-pay | Admitting: Emergency Medicine

## 2015-04-25 ENCOUNTER — Emergency Department (HOSPITAL_COMMUNITY)
Admission: EM | Admit: 2015-04-25 | Discharge: 2015-04-26 | Disposition: A | Discharge status: Home / Self Care | Payer: BLUE CROSS/BLUE SHIELD | Attending: Emergency Medicine | Admitting: Emergency Medicine

## 2015-04-25 ENCOUNTER — Emergency Department (HOSPITAL_COMMUNITY): Payer: BLUE CROSS/BLUE SHIELD

## 2015-04-25 DIAGNOSIS — Z8669 Personal history of other diseases of the nervous system and sense organs: Secondary | ICD-10-CM | POA: Diagnosis not present

## 2015-04-25 DIAGNOSIS — R0602 Shortness of breath: Secondary | ICD-10-CM | POA: Insufficient documentation

## 2015-04-25 DIAGNOSIS — I10 Essential (primary) hypertension: Secondary | ICD-10-CM

## 2015-04-25 DIAGNOSIS — E669 Obesity, unspecified: Secondary | ICD-10-CM | POA: Diagnosis not present

## 2015-04-25 DIAGNOSIS — R51 Headache: Secondary | ICD-10-CM | POA: Diagnosis present

## 2015-04-25 DIAGNOSIS — Z79899 Other long term (current) drug therapy: Secondary | ICD-10-CM | POA: Diagnosis not present

## 2015-04-25 DIAGNOSIS — Z87891 Personal history of nicotine dependence: Secondary | ICD-10-CM | POA: Insufficient documentation

## 2015-04-25 DIAGNOSIS — R002 Palpitations: Secondary | ICD-10-CM

## 2015-04-25 DIAGNOSIS — R519 Headache, unspecified: Secondary | ICD-10-CM

## 2015-04-25 DIAGNOSIS — G4700 Insomnia, unspecified: Secondary | ICD-10-CM | POA: Diagnosis not present

## 2015-04-25 HISTORY — DX: Essential (primary) hypertension: I10

## 2015-04-25 LAB — CBC
HEMATOCRIT: 40 % (ref 39.0–52.0)
Hemoglobin: 13.1 g/dL (ref 13.0–17.0)
MCH: 30.2 pg (ref 26.0–34.0)
MCHC: 32.8 g/dL (ref 30.0–36.0)
MCV: 92.2 fL (ref 78.0–100.0)
Platelets: 280 10*3/uL (ref 150–400)
RBC: 4.34 MIL/uL (ref 4.22–5.81)
RDW: 15 % (ref 11.5–15.5)
WBC: 12.7 10*3/uL — AB (ref 4.0–10.5)

## 2015-04-25 LAB — BASIC METABOLIC PANEL
Anion gap: 13 (ref 5–15)
BUN: 9 mg/dL (ref 6–20)
CHLORIDE: 101 mmol/L (ref 101–111)
CO2: 25 mmol/L (ref 22–32)
CREATININE: 1 mg/dL (ref 0.61–1.24)
Calcium: 9.2 mg/dL (ref 8.9–10.3)
GFR calc non Af Amer: >60 mL/min (ref >60)
GLUCOSE: 115 mg/dL — AB (ref 65–99)
Potassium: 4.2 mmol/L (ref 3.5–5.1)
Sodium: 139 mmol/L (ref 135–145)

## 2015-04-25 LAB — TROPONIN I: Troponin I: 0.03 ng/mL (ref <0.031)

## 2015-04-25 MED ORDER — METOCLOPRAMIDE HCL 5 MG/ML IJ SOLN
10.0000 mg | Freq: Once | INTRAMUSCULAR | Status: AC
  Administered 2015-04-25: 10 mg via INTRAVENOUS
  Filled 2015-04-25: qty 2

## 2015-04-25 MED ORDER — DEXAMETHASONE SODIUM PHOSPHATE 10 MG/ML IJ SOLN
10.0000 mg | Freq: Once | INTRAMUSCULAR | Status: AC
  Administered 2015-04-25: 10 mg via INTRAVENOUS
  Filled 2015-04-25: qty 1

## 2015-04-25 MED ORDER — KETOROLAC TROMETHAMINE 30 MG/ML IJ SOLN
30.0000 mg | Freq: Once | INTRAMUSCULAR | Status: AC
  Administered 2015-04-25: 30 mg via INTRAVENOUS
  Filled 2015-04-25: qty 1

## 2015-04-25 MED ORDER — SODIUM CHLORIDE 0.9 % IV BOLUS (SEPSIS)
500.0000 mL | Freq: Once | INTRAVENOUS | Status: AC
  Administered 2015-04-25: 500 mL via INTRAVENOUS

## 2015-04-25 MED ORDER — DIPHENHYDRAMINE HCL 50 MG/ML IJ SOLN
12.5000 mg | Freq: Once | INTRAMUSCULAR | Status: AC
  Administered 2015-04-25: 12.5 mg via INTRAVENOUS
  Filled 2015-04-25: qty 1

## 2015-04-25 NOTE — ED Provider Notes (Signed)
CSN: [id number]     Arrival date & time 04/25/15  1332 History   First MD Initiated Contact with Patient 04/25/15 1928     Chief Complaint  Patient presents with  . Hypertension  . Headache  . Shortness of Breath     (Consider location/radiation/quality/duration/timing/severity/associated sxs/prior Treatment) HPI   Shaun Stokes is a 50 year old male with a past mental history of HTN, HLD who presents to the emergency department today complaining of palpitations and headache. Patient states today he was walking up a flight of steps when he became very short of breath and dizzy. Patient states that he felt like his heart was racing. This episode lasted for 1 minute. He subsequently developed a frontal headache which is persisted. No associated chest pain or syncope. Patient states that he has been having progressively worsening dyspnea on exertion over the last 6 months after he stopped smoking. He has a scheduled up with the pulmonologist tomorrow to determine whether or not he has COPD. Patient has also been seen in the emergency department 3 times in the last month for palpitations and dizziness similar to his symptoms today. Patient states the only difference is that today he developed a headache. He did not take any medications for his symptoms. Denies blurry vision, paresthesias, vomiting, neck pain.  Past Medical History  Diagnosis Date  . Insomnia   . Obesity   . Prediabetes   . Hyperlipemia   . Vitamin D deficiency   . Chest pain   . Goiter   . OSA (obstructive sleep apnea) 08/24/2014  . Hypertension    Past Surgical History  Procedure Laterality Date  . No past surgeries     Family History  Problem Relation Age of Onset  . Heart attack Father    Social History  Substance Use Topics  . Smoking status: Former Smoker -- 0.50 packs/day for 32 years    Types: Cigarettes  . Smokeless tobacco: Never Used     Comment: 6-10 cigs daily (06/29/14)  . Alcohol Use: 1.8 oz/week    3  Shots of liquor, 0 Standard drinks or equivalent per week     Comment: 2-3 drinks twice week    Review of Systems  All other systems reviewed and are negative.     Allergies  Review of patient's allergies indicates no known allergies.  Home Medications   Prior to Admission medications   Medication Sig Start Date End Date Taking? Authorizing Provider  acetaminophen (TYLENOL) 500 MG tablet Take 500 mg by mouth every 4 (four) hours as needed for mild pain or moderate pain.    Yes Historical Provider, MD  citalopram (CELEXA) 40 MG tablet Take 40 mg by mouth daily.   Yes Historical Provider, MD  losartan (COZAAR) 25 MG tablet Take 1 tablet (25 mg total) by mouth daily. 04/12/15  Yes Addison Lank, MD   BP 168/88 mmHg  Pulse 86  Temp(Src) 98.2 F (36.8 C) (Oral)  Resp 18  SpO2 99% Physical Exam  Constitutional: He is oriented to person, place, and time. He appears well-developed and well-nourished. No distress.  HENT:  Head: Normocephalic and atraumatic.  Mouth/Throat: Oropharynx is clear and moist. No oropharyngeal exudate.  Eyes: Conjunctivae and EOM are normal. Pupils are equal, round, and reactive to light. Right eye exhibits no discharge. Left eye exhibits no discharge. No scleral icterus.  Neck: Neck supple.  Cardiovascular: Normal rate, regular rhythm, normal heart sounds and intact distal pulses.  Exam reveals no gallop and no  friction rub.   No murmur heard. Pulmonary/Chest: Effort normal and breath sounds normal. No respiratory distress. He has no wheezes. He has no rales. He exhibits no tenderness.  Abdominal: Soft. He exhibits no distension. There is no tenderness. There is no guarding.  Musculoskeletal: Normal range of motion. He exhibits no edema.  Lymphadenopathy:    He has no cervical adenopathy.  Neurological: He is alert and oriented to person, place, and time.  Strength 5/5 throughout. No sensory deficits.  No gait abnormality  Skin: Skin is warm and dry. No  rash noted. He is not diaphoretic. No erythema. No pallor.  Psychiatric: He has a normal mood and affect. His behavior is normal.  Nursing note and vitals reviewed.   ED Course  Procedures (including critical care time) Labs Review Labs Reviewed  BASIC METABOLIC PANEL - Abnormal; Notable for the following:    Glucose, Bld 115 (*)    All other components within normal limits  CBC - Abnormal; Notable for the following:    WBC 12.7 (*)    All other components within normal limits  TROPONIN I    Imaging Review Dg Chest 2 View  04/25/2015  CLINICAL DATA:  Short of breath.  High blood pressure. EXAM: CHEST  2 VIEW COMPARISON:  04/11/2015 FINDINGS: Normal heart size. Vascular congestion. No sign of pulmonary edema. No pneumothorax or pleural effusion. IMPRESSION: Vascular congestion without pulmonary edema. Electronically Signed   By: Marybelle Killings M.D.   On: 04/25/2015 14:16   I have personally reviewed and evaluated these images and lab results as part of my medical decision-making.   EKG Interpretation   Date/Time:  Monday April 25 2015 13:45:27 EDT Ventricular Rate:  95 PR Interval:  170 QRS Duration: 76 QT Interval:  358 QTC Calculation: 449 R Axis:   15 Text Interpretation:  Normal sinus rhythm Septal infarct , age  undetermined Abnormal ECG ED PHYSICIAN INTERPRETATION AVAILABLE IN [facility] Confirmed by TEST, Record ([id number]) on 04/26/2015 6:42:16 AM      MDM   Final diagnoses:  Essential hypertension  Palpitations  Acute nonintractable headache, unspecified headache type    50 year old male with past medical history of HTN presents to the ED complaining of palpitations, headache and dizziness. Of note patient has been seen in the ED 3 times in the last month for the same complaint. Patient appears well in the ED. currently not experiencing palpitations or dizziness. No chest pain. Still complaining of headache. No neurological deficits. Moderately hypertensive, BP  is 170/104. Patient states that he has been taking his home BP medication losartan. No evidence of end organ damage on his blood work. Mild leukocytosis, W BC 12.7. Patient has had similar W BC lab values at his previous visits, nonspecific.  EKG unchanged from previous. Troponin normal. Chest x-ray reveals vascular congestion without pulmonary edema. Patient became short of breath on exertion today. He states this has been an ongoing issue worsening over the last 6 months after he quit smoking. He has an appointment with the pulmonologist tomorrow to evaluate for COPD. Patient given migraine cocktail which alleviated his headache. His BP came down to 140/70. Do not think patient's symptoms are cardiac in etiology. Patient has close follow up with pulmonology tomorrow. Blood work reassuring. We'll discharge home with close follow-up. Encourage patient to keep blood pressure journal. Patient may require additional blood pressure medication to his regimen. Instructed patient to follow up with his primary care doctor about this issue. Return precautions outlined  in patient discharge instructions.   Case discussed with Dr. Jeanell Sparrow who agrees with treatment plan.    Dondra Spry Huxley, PA-C 04/27/15 1609  Pattricia Boss, MD 04/28/15 1224

## 2015-04-25 NOTE — Discharge Instructions (Signed)
General Headache Without Cause A headache is pain or discomfort felt around the head or neck area. The specific cause of a headache may not be found. There are many causes and types of headaches. A few common ones are:  Tension headaches.  Migraine headaches.  Cluster headaches.  Chronic daily headaches. HOME CARE INSTRUCTIONS  Watch your condition for any changes. Take these steps to help with your condition: Managing Pain  Take over-the-counter and prescription medicines only as told by your health care provider.  Lie down in a dark, quiet room when you have a headache.  If directed, apply ice to the head and neck area:  Put ice in a plastic bag.  Place a towel between your skin and the bag.  Leave the ice on for 20 minutes, 2-3 times per day.  Use a heating pad or hot shower to apply heat to the head and neck area as told by your health care provider.  Keep lights dim if bright lights bother you or make your headaches worse. Eating and Drinking  Eat meals on a regular schedule.  Limit alcohol use.  Decrease the amount of caffeine you drink, or stop drinking caffeine. General Instructions  Keep all follow-up visits as told by your health care provider. This is important.  Keep a headache journal to help find out what may trigger your headaches. For example, write down:  What you eat and drink.  How much sleep you get.  Any change to your diet or medicines.  Try massage or other relaxation techniques.  Limit stress.  Sit up straight, and do not tense your muscles.  Do not use tobacco products, including cigarettes, chewing tobacco, or e-cigarettes. If you need help quitting, ask your health care provider.  Exercise regularly as told by your health care provider.  Sleep on a regular schedule. Get 7-9 hours of sleep, or the amount recommended by your health care provider. SEEK MEDICAL CARE IF:   Your symptoms are not helped by medicine.  You have a  headache that is different from the usual headache.  You have nausea or you vomit.  You have a fever. SEEK IMMEDIATE MEDICAL CARE IF:   Your headache becomes severe.  You have repeated vomiting.  You have a stiff neck.  You have a loss of vision.  You have problems with speech.  You have pain in the eye or ear.  You have muscular weakness or loss of muscle control.  You lose your balance or have trouble walking.  You feel faint or pass out.  You have confusion.   This information is not intended to replace advice given to you by your health care provider. Make sure you discuss any questions you have with your health care provider.   Document Released: 01/29/2005 Document Revised: 10/20/2014 Document Reviewed: 05/24/2014 Elsevier Interactive Patient Education 2016 [employer].  Hypertension Hypertension, commonly called high blood pressure, is when the force of blood pumping through your arteries is too strong. Your arteries are the blood vessels that carry blood from your heart throughout your body. A blood pressure reading consists of a higher number over a lower number, such as 110/72. The higher number (systolic) is the pressure inside your arteries when your heart pumps. The lower number (diastolic) is the pressure inside your arteries when your heart relaxes. Ideally you want your blood pressure below 120/80. Hypertension forces your heart to work harder to pump blood. Your arteries may become narrow or stiff. Having untreated or  uncontrolled hypertension can cause heart attack, stroke, kidney disease, and other problems. °RISK FACTORS °Some risk factors for high blood pressure are controllable. Others are not.  °Risk factors you cannot control include:  °· Race. You may be at higher risk if you are African American. °· Age. Risk increases with age. °· Gender. Men are at higher risk than women before age 45 years. After age 65, women are at higher risk than men. °Risk factors  you can control include: °· Not getting enough exercise or physical activity. °· Being overweight. °· Getting too much fat, sugar, calories, or salt in your diet. °· Drinking too much alcohol. °SIGNS AND SYMPTOMS °Hypertension does not usually cause signs or symptoms. Extremely high blood pressure (hypertensive crisis) may cause headache, anxiety, shortness of breath, and nosebleed. °DIAGNOSIS °To check if you have hypertension, your health care provider will measure your blood pressure while you are seated, with your arm held at the level of your heart. It should be measured at least twice using the same arm. Certain conditions can cause a difference in blood pressure between your right and left arms. A blood pressure reading that is higher than normal on one occasion does not mean that you need treatment. If it is not clear whether you have high blood pressure, you may be asked to return on a different day to have your blood pressure checked again. Or, you may be asked to monitor your blood pressure at home for 1 or more weeks. °TREATMENT °Treating high blood pressure includes making lifestyle changes and possibly taking medicine. Living a healthy lifestyle can help lower high blood pressure. You may need to change some of your habits. °Lifestyle changes may include: °· Following the DASH diet. This diet is high in fruits, vegetables, and whole grains. It is low in salt, red meat, and added sugars. °· Keep your sodium intake below 2,300 mg per day. °· Getting at least 30-45 minutes of aerobic exercise at least 4 times per week. °· Losing weight if necessary. °· Not smoking. °· Limiting alcoholic beverages. °· Learning ways to reduce stress. °Your health care provider may prescribe medicine if lifestyle changes are not enough to get your blood pressure under control, and if one of the following is true: °· You are 18-59 years of age and your systolic blood pressure is above 140. °· You are 60 years of age or older,  and your systolic blood pressure is above 150. °· Your diastolic blood pressure is above 90. °· You have diabetes, and your systolic blood pressure is over 140 or your diastolic blood pressure is over 90. °· You have kidney disease and your blood pressure is above 140/90. °· You have heart disease and your blood pressure is above 140/90. °Your personal target blood pressure may vary depending on your medical conditions, your age, and other factors. °HOME CARE INSTRUCTIONS °· Have your blood pressure rechecked as directed by your health care provider.   °· Take medicines only as directed by your health care provider. Follow the directions carefully. Blood pressure medicines must be taken as prescribed. The medicine does not work as well when you skip doses. Skipping doses also puts you at risk for problems. °· Do not smoke.   °· Monitor your blood pressure at home as directed by your health care provider.  °SEEK MEDICAL CARE IF:  °· You think you are having a reaction to medicines taken. °· You have recurrent headaches or feel dizzy. °· You have swelling in your   ankles.  You have trouble with your vision. SEEK IMMEDIATE MEDICAL CARE IF:  You develop a severe headache or confusion.  You have unusual weakness, numbness, or feel faint.  You have severe chest or abdominal pain.  You vomit repeatedly.  You have trouble breathing. MAKE SURE YOU:   Understand these instructions.  Will watch your condition.  Will get help right away if you are not doing well or get worse.   This information is not intended to replace advice given to you by your health care provider. Make sure you discuss any questions you ha Palpitations A palpitation is the feeling that your heartbeat is irregular or is faster than normal. It may feel like your heart is fluttering or skipping a beat. Palpitations are usually not a serious problem. However, in some cases, you may need further medical evaluation. CAUSES  Palpitations  can be caused by:  Smoking.  Caffeine or other stimulants, such as diet pills or energy drinks.  Alcohol.  Stress and anxiety.  Strenuous physical activity.  Fatigue.  Certain medicines.  Heart disease, especially if you have a history of irregular heart rhythms (arrhythmias), such as atrial fibrillation, atrial flutter, or supraventricular tachycardia.  An improperly working pacemaker or defibrillator. DIAGNOSIS  To find the cause of your palpitations, your health care provider will take your medical history and perform a physical exam. Your health care provider may also have you take a test called an ambulatory electrocardiogram (ECG). An ECG records your heartbeat patterns over a 24-hour period. You may also have other tests, such as:  Transthoracic echocardiogram (TTE). During echocardiography, sound waves are used to evaluate how blood flows through your heart.  Transesophageal echocardiogram (TEE).  Cardiac monitoring. This allows your health care provider to monitor your heart rate and rhythm in real time.  Holter monitor. This is a portable device that records your heartbeat and can help diagnose heart arrhythmias. It allows your health care provider to track your heart activity for several days, if needed.  Stress tests by exercise or by giving medicine that makes the heart beat faster. TREATMENT  Treatment of palpitations depends on the cause of your symptoms and can vary greatly. Most cases of palpitations do not require any treatment other than time, relaxation, and monitoring your symptoms. Other causes, such as atrial fibrillation, atrial flutter, or supraventricular tachycardia, usually require further treatment. HOME CARE INSTRUCTIONS   Avoid:  Caffeinated coffee, tea, soft drinks, diet pills, and energy drinks.  Chocolate.  Alcohol.  Stop smoking if you smoke.  Reduce your stress and anxiety. Things that can help you relax include:  A method of  controlling things in your body, such as your heartbeats, with your mind (biofeedback).  Yoga.  Meditation.  Physical activity such as swimming, jogging, or walking.  Get plenty of rest and sleep. SEEK MEDICAL CARE IF:   You continue to have a fast or irregular heartbeat beyond 24 hours.  Your palpitations occur more often. SEEK IMMEDIATE MEDICAL CARE IF:  You have chest pain or shortness of breath.  You have a severe headache.  You feel dizzy or you faint. MAKE SURE YOU:  Understand these instructions.  Will watch your condition.  Will get help right away if you are not doing well or get worse.   This information is not intended to replace advice given to you by your health care provider. Make sure you discuss any questions you have with your health care provider.   Follow-up with your  primary care provider for reevaluation and medication adjustment. You may require additional blood pressure medication to your regimen for better control. Keep a daily journal of your blood pressures. Keep scheduled with her pulmonologist tomorrow. Continue taking home blood pressure medication as prescribed. Return to the emergency department if you experience severe worsening of your symptoms, loss of consciousness, chest pain, difficulty breathing.

## 2015-04-25 NOTE — ED Notes (Signed)
Pt arrives via EMS from school with dizziness, lightheaded sensation and SOB after walking upstairs. Pt reports HA. States also concerned bp is elevated. Pt alert, oriented x4, VSS.

## 2015-04-26 ENCOUNTER — Ambulatory Visit (INDEPENDENT_AMBULATORY_CARE_PROVIDER_SITE_OTHER): Payer: BLUE CROSS/BLUE SHIELD | Admitting: Internal Medicine

## 2015-04-26 ENCOUNTER — Other Ambulatory Visit: Payer: Worker's Compensation

## 2015-04-26 ENCOUNTER — Encounter: Payer: Self-pay | Admitting: Internal Medicine

## 2015-04-26 VITALS — BP 140/70 | HR 107 | Ht 74.0 in | Wt 390.0 lb

## 2015-04-26 DIAGNOSIS — R0600 Dyspnea, unspecified: Secondary | ICD-10-CM | POA: Diagnosis not present

## 2015-04-26 DIAGNOSIS — I10 Essential (primary) hypertension: Secondary | ICD-10-CM

## 2015-04-26 LAB — D-DIMER, QUANTITATIVE: D-Dimer, Quant: 1.42 ug/mL-FEU — ABNORMAL HIGH (ref 0.00–0.48)

## 2015-04-26 MED ORDER — NEBIVOLOL HCL 5 MG PO TABS: 5.0000 mg | ORAL_TABLET | Freq: Every day | ORAL | Status: DC

## 2015-04-26 MED ORDER — TRAZODONE HCL 50 MG PO TABS: 50.0000 mg | ORAL_TABLET | Freq: Every day | ORAL | Status: DC

## 2015-04-26 NOTE — Progress Notes (Signed)
Quick Note:  LMTCB ______ 

## 2015-04-26 NOTE — Progress Notes (Signed)
Subjective:    Patient ID: Shaun Stokes, male    DOB: 08/31/1965      MRN: [id number]  HPI  29 yobm quit smoking 11/2014  eval and Rx by Dr Halford Chessman for OSA referred by Dr Dorette Grate for progressive doe.    04/26/2015 1st [facility] office visit/ Shaun Stokes   Chief Complaint  Patient presents with  . Pulmonary Consult    Self referral. Pt c/o DOE since quit smoking 11-18-14. He gets SOB walking up stairs and short distances on flat surfaces. He was winded walking from parking lot to our building today.   at point when stopped smoking wt was 345 and no doe and gradually worse since then but ok at rest and also ok  on cpap at hs  Problem occurs with any exertion feels palpitations x 25m  > to ER x 4 x 6 m with neg cardiac eval every time  No obvious   day to day or daytime variabilty or assoc excess/ purulent sputum or mucus plugs   or cp or chest tightness, subjective wheeze overt sinus or hb symptoms. No unusual exp hx or h/o childhood pna/ asthma or knowledge of premature birth.  Sleeping ok without nocturnal  or early am exacerbation  of respiratory  c/o's or need for noct saba. Also denies any obvious fluctuation of symptoms with weather or environmental changes or other aggravating or alleviating factors except as outlined above   Current Medications, Allergies, Complete Past Medical History, Past Surgical History, Family History, and Social History were reviewed in [employer] record.              Review of Systems  Constitutional: Negative for fever, chills, activity change, appetite change and unexpected weight change.  HENT: Negative for congestion, dental problem, postnasal drip, rhinorrhea, sneezing, sore throat, trouble swallowing and voice change.   Eyes: Negative for visual disturbance.  Respiratory: Positive for shortness of breath. Negative for cough and choking.   Cardiovascular: Negative for chest pain and leg swelling.  Gastrointestinal:  Negative for nausea, vomiting and abdominal pain.  Genitourinary: Negative for difficulty urinating.  Musculoskeletal: Negative for arthralgias.  Skin: Negative for rash.  Psychiatric/Behavioral: Negative for behavioral problems and confusion.       Objective:   Physical Exam  amb bm nad   Wt Readings from Last 3 Encounters:  04/26/15 390 lb (176.903 kg)  06/29/14 352 lb (159.666 kg)  06/02/14 350 lb (158.759 kg)    Vital signs reviewed   HEENT: nl dentition, turbinates, and oropharynx. Nl external ear canals without cough reflex   NECK :  without JVD/Nodes/TM/ nl carotid upstrokes bilaterally   LUNGS: no acc muscle use,  Nl contour chest which is clear to A and P bilaterally without cough on insp or exp maneuvers   CV:  RRR  no s3 or murmur or increase in P2,  R > L lower ext pitting   ABD:  soft and nontender with nl inspiratory excursion in the supine position. No bruits or organomegaly, bowel sounds nl  MS:  Nl gait/ ext warm without deformities, calf tenderness, cyanosis or clubbing No obvious joint restrictions   SKIN: warm and dry without lesions    NEURO:  alert, approp, nl sensorium with  no motor deficits      I personally reviewed images and agree with radiology impression as follows:  CXR:  04/25/15 Vascular congestion without pulmonary edema  Labs  reviewed:  Chemistry      Component Value Date/Time   NA 139 04/25/2015 1357   K 4.2 04/25/2015 1357   CL 101 04/25/2015 1357   CO2 25 04/25/2015 1357   BUN 9 04/25/2015 1357   CREATININE 1.00 04/25/2015 1357      Component Value Date/Time   CALCIUM 9.2 04/25/2015 1357        Lab Results  Component Value Date   WBC 12.7* 04/25/2015   HGB 13.1 04/25/2015   HCT 40.0 04/25/2015   MCV 92.2 04/25/2015   PLT 280 04/25/2015        Lab Results  Component Value Date   TSH 1.320 04/11/2015      BNP         04/11/15  =  29.5     Lab Results  Component Value Date   DDIMER 1.42*  04/26/2015        Assessment & Plan:

## 2015-04-26 NOTE — Patient Instructions (Addendum)
Weight control is simply a matter of calorie balance which needs to be tilted in your favor by eating less and exercising more.  To get the most out of exercise, you need to be continuously aware that you are short of breath, but never out of breath, for 30 minutes daily. As you improve, it will actually be easier for you to do the same amount of exercise  in  30 minutes so always push to the level where you are short of breath.  If this does not result in gradual weight reduction then I strongly recommend you see a nutritionist with a food diary x 2 weeks so that we can work out a negative calorie balance which is universally effective in steady weight loss programs.  Think of your calorie balance like you do your bank account where in this case you want the balance to go down so you must take in less calories than you burn up.  It's just that simple:  Hard to do, but easy to understand.  Good luck!   Bystolic 5 mg one tablet daily until you see your primary care doctor  Please remember to go to the lab   department downstairs for your tests - we will call you with the results when they are available.  Pulmonary follow up is as needed

## 2015-04-27 ENCOUNTER — Telehealth: Payer: Self-pay | Admitting: Internal Medicine

## 2015-04-27 ENCOUNTER — Ambulatory Visit (HOSPITAL_COMMUNITY)
Admission: RE | Admit: 2015-04-27 | Discharge: 2015-04-27 | Disposition: A | Discharge status: Home / Self Care | Payer: BLUE CROSS/BLUE SHIELD | Source: Ambulatory Visit | Attending: Internal Medicine | Admitting: Internal Medicine

## 2015-04-27 ENCOUNTER — Other Ambulatory Visit: Payer: Self-pay | Admitting: Internal Medicine

## 2015-04-27 ENCOUNTER — Encounter: Payer: Self-pay | Admitting: Internal Medicine

## 2015-04-27 ENCOUNTER — Ambulatory Visit (INDEPENDENT_AMBULATORY_CARE_PROVIDER_SITE_OTHER)
Admission: RE | Admit: 2015-04-27 | Discharge: 2015-04-27 | Disposition: A | Discharge status: Home / Self Care | Payer: BLUE CROSS/BLUE SHIELD | Source: Ambulatory Visit | Attending: Internal Medicine | Admitting: Internal Medicine

## 2015-04-27 DIAGNOSIS — I10 Essential (primary) hypertension: Secondary | ICD-10-CM | POA: Diagnosis not present

## 2015-04-27 DIAGNOSIS — R7303 Prediabetes: Secondary | ICD-10-CM | POA: Insufficient documentation

## 2015-04-27 DIAGNOSIS — R0602 Shortness of breath: Secondary | ICD-10-CM | POA: Diagnosis not present

## 2015-04-27 DIAGNOSIS — E785 Hyperlipidemia, unspecified: Secondary | ICD-10-CM | POA: Diagnosis not present

## 2015-04-27 DIAGNOSIS — R7989 Other specified abnormal findings of blood chemistry: Secondary | ICD-10-CM

## 2015-04-27 DIAGNOSIS — R0600 Dyspnea, unspecified: Secondary | ICD-10-CM

## 2015-04-27 DIAGNOSIS — R791 Abnormal coagulation profile: Secondary | ICD-10-CM

## 2015-04-27 DIAGNOSIS — G4733 Obstructive sleep apnea (adult) (pediatric): Secondary | ICD-10-CM | POA: Insufficient documentation

## 2015-04-27 MED ORDER — IOHEXOL 350 MG/ML SOLN
80.0000 mL | Freq: Once | INTRAVENOUS | Status: AC | PRN
  Administered 2015-04-27: 80 mL via INTRAVENOUS

## 2015-04-27 NOTE — Telephone Encounter (Signed)
Notes Recorded by Rosana Berger, CMA on 04/27/2015 at 8:29 AM LMTCB Notes Recorded by Rosana Berger, CMA on 04/26/2015 at 5:35 PM LMTCB Notes Recorded by Tanda Rockers, MD on 04/26/2015 at 4:54 PM Call patient : Study is worrisome for clots > needs CTa chest and venous dopplers but ok to do 3/15 as this is chronic problem if present --------------------------- lmtcb x1 for pt.

## 2015-04-27 NOTE — Assessment & Plan Note (Signed)
Given ? Of airways dz ? Strongly prefer in this setting: Bystolic, the most beta -1  selective Beta blocker available in sample form, with bisoprolol the most selective generic choice  on the market.   Try bystolic 5 mg daily samples pending Follow up per Primary Care planned

## 2015-04-27 NOTE — Progress Notes (Signed)
Quick Note:  LMTCB ______ 

## 2015-04-27 NOTE — Telephone Encounter (Signed)
Pt returning call. [phone]

## 2015-04-27 NOTE — Telephone Encounter (Signed)
Pt is aware of results. Orders have been placed per MW. Nothing further was needed.

## 2015-04-27 NOTE — Assessment & Plan Note (Signed)
Body mass index is 50.05   Lab Results  Component Value Date   TSH 1.320 04/11/2015     Contributing to gerd tendency/ doe/reviewed the need and the process to achieve and maintain neg calorie balance > defer f/u primary care including intermittently monitoring thyroid status

## 2015-04-27 NOTE — Assessment & Plan Note (Addendum)
Spirometry 04/26/2015  FEV1 3.00  (68%)  Ratio 81  - 04/26/2015  Walked RA x 3 laps @ 185 ft each stopped due to  End of study, moderate  pace, no sob or desat    - CTa chest 04/26/2015 >>>  Symptoms are markedly disproportionate to objective findings and not clear this is a lung problem but pt does appear to have difficult airway management issues. DDX of  difficult airways management almost all start with A and  include Adherence, Ace Inhibitors, Acid Reflux, Active Sinus Disease, Alpha 1 Antitripsin deficiency, Anxiety masquerading as Airways dz,  ABPA,  Allergy(esp in young), Aspiration (esp in elderly), Adverse effects of meds,  Active smokers, A bunch of PE's (a small clot burden can't cause this syndrome unless there is already severe underlying pulm or vascular dz with poor reserve) plus two Bs  = Bronchiectasis and Beta blocker use..and one C= CHF  Adherence is always the initial "prime suspect" and is a multilayered concern that requires a "trust but verify" approach in every patient - starting with knowing how to use medications, especially inhalers, correctly, keeping up with refills and understanding the fundamental difference between maintenance and prns vs those medications only taken for a very short course and then stopped and not refilled.   Allergy/asthma > very unlikely without nocturnal complaints or cough or variability and spirometry rules out copd in ddx   Anxiety related to weight gain and deconditioning are usually at the end of the differential diagnosis but I believe are much higher in this particular case.  ? A bunch of PEs > his d-dimer is moderately elevated and does not suggest a large clot burden but it is not normal and he does have asymmetric leg swelling so he needs venous Dopplers and CT Joetta Manners at this point to be sure, especially given his obesity.  ? chf > very unlikely with BNP so low but note that he does have associated palpitations and could have enough  tachyarrhythmias to cause elevated left-sided pressures. Empirically I'm going to started therefore on Bystolic 5 mg daily pending the results of his workup..  Total time devoted to counseling  = 35/31m review case with pt/ discussion of options/alternatives/ personally creating in presence of pt  then going over specific  Instructions directly with the pt including how to use all of the meds but in particular covering each new medication in detail (see avs)

## 2015-04-27 NOTE — Telephone Encounter (Signed)
LVM for pt to return call

## 2015-04-28 NOTE — Telephone Encounter (Signed)
Results have been explained to patient, pt expressed understanding.  Pt states he plans to go to his PCP for regrouping. Nothing further needed.  Notes Recorded by Tanda Rockers, MD on 04/27/2015 at 7:50 PM Call patient : Study is unremarkable, no evidence of blood clot and all we found so far is wt issue so rec return to primary care(or here) before bystolic samples run out to regroup

## 2015-05-13 ENCOUNTER — Telehealth: Payer: Self-pay | Admitting: Internal Medicine

## 2015-05-13 NOTE — Telephone Encounter (Signed)
LMTCB x1 Pt needs to contact insurance to see what is less expensive

## 2015-05-16 NOTE — Telephone Encounter (Signed)
Spoke with pt, states he will get covered alternatives from insurance formulary and call back with this info later this week.  Will await call.

## 2015-05-19 NOTE — Telephone Encounter (Signed)
lmtcb X1 for pt to follow up on

## 2015-05-20 NOTE — Telephone Encounter (Signed)
lmtcb X2 for pt.  

## 2015-05-23 NOTE — Telephone Encounter (Signed)
Pt states that he has not called the insurance company yet regarding his alternative meds to Bystolic.  Pt to call back once he talks to insurance for finds his formulary booklet. Will await call back

## 2015-08-17 ENCOUNTER — Other Ambulatory Visit: Payer: Self-pay | Admitting: Gastroenterology

## 2015-09-06 ENCOUNTER — Emergency Department (HOSPITAL_COMMUNITY): Payer: BLUE CROSS/BLUE SHIELD

## 2015-09-06 ENCOUNTER — Encounter (HOSPITAL_COMMUNITY): Payer: Self-pay

## 2015-09-06 ENCOUNTER — Emergency Department (HOSPITAL_COMMUNITY)
Admission: EM | Admit: 2015-09-06 | Discharge: 2015-09-06 | Disposition: A | Discharge status: Home / Self Care | Payer: BLUE CROSS/BLUE SHIELD | Attending: Emergency Medicine | Admitting: Emergency Medicine

## 2015-09-06 DIAGNOSIS — Z87891 Personal history of nicotine dependence: Secondary | ICD-10-CM | POA: Insufficient documentation

## 2015-09-06 DIAGNOSIS — M25551 Pain in right hip: Secondary | ICD-10-CM

## 2015-09-06 DIAGNOSIS — Z79899 Other long term (current) drug therapy: Secondary | ICD-10-CM | POA: Diagnosis not present

## 2015-09-06 DIAGNOSIS — I10 Essential (primary) hypertension: Secondary | ICD-10-CM | POA: Diagnosis not present

## 2015-09-06 MED ORDER — NAPROXEN 375 MG PO TABS: 375.0000 mg | ORAL_TABLET | Freq: Two times a day (BID) | ORAL | 0 refills | Status: DC

## 2015-09-06 NOTE — ED Triage Notes (Signed)
Pt here for hip pain, onset while at work yesterday ,sts he htought he dislocated it but it didn't go away while sleeping, pt  Did ambulate into facility

## 2015-09-06 NOTE — ED Provider Notes (Signed)
[facility] DEPT Provider Note   CSN: [id number] Arrival date & time: 09/06/15  0541  First Provider Contact:  None       History   Chief Complaint Chief Complaint  Patient presents with  . Hip Problem    HPI Shaun Stokes is a 50 y.o. male.  Patient presents to the ED with a chief complaint of right hip pain.  States that he noticed the pain yesterday while he was at work.  Denies any injury or trauma.  Denies any fevers, chills, swelling.  Reports that he does have chronic low back pain, but denies any radiating pain into his lower extremities.  Denies any bowel or bladder incontinence.  Has not tried taking anything for the pain. Symptoms are worsened with movement.   The history is provided by the patient. No language interpreter was used.    Past Medical History:  Diagnosis Date  . Chest pain   . Goiter   . Hyperlipemia   . Hypertension   . Insomnia   . Obesity   . OSA (obstructive sleep apnea) 08/24/2014  . Prediabetes   . Vitamin D deficiency     Patient Active Problem List   Diagnosis Date Noted  . Essential hypertension 04/27/2015  . Dyspnea 04/26/2015  . OSA (obstructive sleep apnea) 08/24/2014  . Streptococcal sore throat 06/04/2014  . Pharyngitis 06/02/2014  . Leukocytosis 06/02/2014  . Severe obesity (BMI >= 40) ([place]) 06/02/2014  . Pharyngitis, acute 06/02/2014  . Prediabetes   . Vitamin D deficiency     Past Surgical History:  Procedure Laterality Date  . NO PAST SURGERIES         Home Medications    Prior to Admission medications   Medication Sig Start Date End Date Taking? Authorizing Provider  acetaminophen (TYLENOL) 500 MG tablet Take 500 mg by mouth every 4 (four) hours as needed for mild pain or moderate pain.    Yes Historical Provider, MD  carvedilol (COREG) 3.125 MG tablet Take 3.125 mg by mouth 2 (two) times daily. 08/23/15  Yes Historical Provider, MD  citalopram (CELEXA) 40 MG tablet Take 40 mg by mouth daily.   Yes  Historical Provider, MD  diclofenac (VOLTAREN) 75 MG EC tablet Take 75 mg by mouth 2 (two) times daily. 08/23/15  Yes Historical Provider, MD  furosemide (LASIX) 40 MG tablet Take 40 mg by mouth daily. 08/23/15  Yes Historical Provider, MD  losartan (COZAAR) 25 MG tablet Take 1 tablet (25 mg total) by mouth daily. 04/12/15  Yes Fatima Blank, MD  nebivolol (BYSTOLIC) 5 MG tablet Take 1 tablet (5 mg total) by mouth daily. 04/26/15  Yes Tanda Rockers, MD  RA VITAMIN D-3 1000 units tablet Take 1,000 Units by mouth daily. 08/23/15  Yes Historical Provider, MD  traZODone (DESYREL) 50 MG tablet Take 1 tablet (50 mg total) by mouth at bedtime. 04/26/15  Yes Tanda Rockers, MD    Family History Family History  Problem Relation Age of Onset  . Heart attack Father     Social History Social History  Substance Use Topics  . Smoking status: Former Smoker    Packs/day: 1.00    Years: 32.00    Types: Cigarettes    Quit date: 11/18/2014  . Smokeless tobacco: Never Used  . Alcohol use 1.8 oz/week    3 Shots of liquor per week     Comment: 2-3 drinks twice week     Allergies   Review of patient's  allergies indicates no known allergies.   Review of Systems Review of Systems  All other systems reviewed and are negative.    Physical Exam Updated Vital Signs BP 132/78 (BP Location: Left Arm)   Pulse 95   Temp 98.9 F (37.2 C) (Oral)   Resp 22   Ht 6\' 2"  (1.88 m)   Wt (!) 179.2 kg   SpO2 96%   BMI 50.71 kg/m   Physical Exam Physical Exam  Constitutional: Pt appears well-developed and well-nourished. No distress.  HENT:  Head: Normocephalic and atraumatic.  Eyes: Conjunctivae are normal.  Neck: Normal range of motion.  Cardiovascular: Normal rate, regular rhythm and intact distal pulses.   Capillary refill < 3 sec  Pulmonary/Chest: Effort normal and breath sounds normal.  Musculoskeletal: Pt exhibits mild tenderness over that lateral aspect of the right hip. Pt exhibits no  edema.  ROM: Right hip 5/5  Neurological: Pt  is alert. Coordination normal.  Sensation 5/5 Strength Right hip 5/5  Skin: Skin is warm and dry. Pt is not diaphoretic.  No tenting of the skin  Psychiatric: Pt has a normal mood and affect.  Nursing note and vitals reviewed.   ED Treatments / Results  Labs (all labs ordered are listed, but only abnormal results are displayed)  Radiology Dg Hip Unilat  With Pelvis 2-3 Views Right  Result Date: 09/06/2015 CLINICAL DATA:  Acute onset of lateral right hip pain last night. No known injury. EXAM: DG HIP (WITH OR WITHOUT PELVIS) 2-3V RIGHT COMPARISON:  None. FINDINGS: No evidence of acute, subacute or healed fractures. Well-preserved joint space. Well preserved bone mineral density. Included AP pelvis demonstrates a normal-appearing contralateral left hip joint. Sacroiliac joints and symphysis pubis intact. No intrinsic osseous abnormalities. IMPRESSION: Normal right hip. Electronically Signed   By: Evangeline Dakin M.D.   On: 09/06/2015 08:23   Procedures Procedures (including critical care time)  Medications Ordered in ED Medications - No data to display   Initial Impression / Assessment and Plan / ED Course  I have reviewed the triage vital signs and the nursing notes.  Pertinent labs & imaging results that were available during my care of the patient were reviewed by me and considered in my medical decision making (see chart for details).  Clinical Course    Patient with right hip pain.  No injury or trauma.  Plain films are negative.  Suspect overuse injury such as tendonitis or bursitis.  Recommend ortho follow-up and outpatient PT.  Final Clinical Impressions(s) / ED Diagnoses   Final diagnoses:  Hip pain, acute, right    New Prescriptions New Prescriptions   NAPROXEN (NAPROSYN) 375 MG TABLET    Take 1 tablet (375 mg total) by mouth 2 (two) times daily.     Montine Circle, PA-C 09/06/15 [id number]    Virgel Manifold,  MD 09/10/15 1500

## 2015-09-13 ENCOUNTER — Encounter (HOSPITAL_COMMUNITY): Payer: Self-pay | Admitting: *Deleted

## 2015-09-15 ENCOUNTER — Encounter (HOSPITAL_COMMUNITY): Payer: Self-pay | Admitting: Anesthesiology

## 2015-09-15 NOTE — Anesthesia Preprocedure Evaluation (Addendum)
Anesthesia Evaluation  Patient identified by MRN, date of birth, ID band Patient awake    Reviewed: Allergy & Precautions, NPO status , Patient's Chart, lab work & pertinent test results  History of Anesthesia Complications Negative for: history of anesthetic complications  Airway Mallampati: II  TM Distance: >3 FB Neck ROM: Full    Dental no notable dental hx.    Pulmonary sleep apnea and Continuous Positive Airway Pressure Ventilation , former smoker,    Pulmonary exam normal        Cardiovascular hypertension, Pt. on medications and Pt. on home beta blockers Normal cardiovascular exam     Neuro/Psych negative neurological ROS  negative psych ROS   GI/Hepatic negative GI ROS, Neg liver ROS,   Endo/Other  Morbid obesity (super obese)  Renal/GU      Musculoskeletal negative musculoskeletal ROS (+)   Abdominal   Peds negative pediatric ROS (+)  Hematology negative hematology ROS (+)   Anesthesia Other Findings   Reproductive/Obstetrics                            Anesthesia Physical Anesthesia Plan  ASA: III  Anesthesia Plan: MAC   Post-op Pain Management:    Induction: Intravenous  Airway Management Planned:   Additional Equipment: None  Intra-op Plan:   Post-operative Plan:   Informed Consent: I have reviewed the patients History and Physical, chart, labs and discussed the procedure including the risks, benefits and alternatives for the proposed anesthesia with the patient or authorized representative who has indicated his/her understanding and acceptance.   Dental advisory given  Plan Discussed with:   Anesthesia Plan Comments:        Anesthesia Quick Evaluation

## 2015-09-16 ENCOUNTER — Encounter (HOSPITAL_COMMUNITY)
Admission: RE | Disposition: A | Discharge status: Home / Self Care | Payer: Self-pay | Source: Ambulatory Visit | Attending: Gastroenterology

## 2015-09-16 ENCOUNTER — Ambulatory Visit (HOSPITAL_COMMUNITY): Payer: BLUE CROSS/BLUE SHIELD | Admitting: Anesthesiology

## 2015-09-16 ENCOUNTER — Ambulatory Visit (HOSPITAL_COMMUNITY)
Admission: RE | Admit: 2015-09-16 | Discharge: 2015-09-16 | Disposition: A | Discharge status: Home / Self Care | Payer: BLUE CROSS/BLUE SHIELD | Source: Ambulatory Visit | Attending: Gastroenterology | Admitting: Gastroenterology

## 2015-09-16 ENCOUNTER — Encounter (HOSPITAL_COMMUNITY): Payer: Self-pay

## 2015-09-16 DIAGNOSIS — Z87891 Personal history of nicotine dependence: Secondary | ICD-10-CM | POA: Insufficient documentation

## 2015-09-16 DIAGNOSIS — I10 Essential (primary) hypertension: Secondary | ICD-10-CM | POA: Diagnosis not present

## 2015-09-16 DIAGNOSIS — K621 Rectal polyp: Secondary | ICD-10-CM | POA: Diagnosis not present

## 2015-09-16 DIAGNOSIS — G4733 Obstructive sleep apnea (adult) (pediatric): Secondary | ICD-10-CM | POA: Insufficient documentation

## 2015-09-16 DIAGNOSIS — E785 Hyperlipidemia, unspecified: Secondary | ICD-10-CM | POA: Insufficient documentation

## 2015-09-16 DIAGNOSIS — D122 Benign neoplasm of ascending colon: Secondary | ICD-10-CM | POA: Diagnosis not present

## 2015-09-16 DIAGNOSIS — E559 Vitamin D deficiency, unspecified: Secondary | ICD-10-CM | POA: Diagnosis not present

## 2015-09-16 DIAGNOSIS — Z79899 Other long term (current) drug therapy: Secondary | ICD-10-CM | POA: Insufficient documentation

## 2015-09-16 DIAGNOSIS — Z6841 Body Mass Index (BMI) 40.0 and over, adult: Secondary | ICD-10-CM | POA: Diagnosis not present

## 2015-09-16 DIAGNOSIS — Z1211 Encounter for screening for malignant neoplasm of colon: Secondary | ICD-10-CM | POA: Diagnosis not present

## 2015-09-16 HISTORY — PX: COLONOSCOPY WITH PROPOFOL: SHX5780

## 2015-09-16 SURGERY — COLONOSCOPY WITH PROPOFOL
Anesthesia: Monitor Anesthesia Care

## 2015-09-16 MED ORDER — SODIUM CHLORIDE 0.9 % IV SOLN: INTRAVENOUS | Status: DC

## 2015-09-16 MED ORDER — ONDANSETRON HCL 4 MG/2ML IJ SOLN
INTRAMUSCULAR | Status: DC | PRN
  Administered 2015-09-16: 4 mg via INTRAVENOUS

## 2015-09-16 MED ORDER — LIDOCAINE HCL (CARDIAC) 20 MG/ML IV SOLN
INTRAVENOUS | Status: AC
  Filled 2015-09-16: qty 5

## 2015-09-16 MED ORDER — LIDOCAINE HCL (CARDIAC) 20 MG/ML IV SOLN
INTRAVENOUS | Status: DC | PRN
  Administered 2015-09-16: 100 mg via INTRAVENOUS

## 2015-09-16 MED ORDER — PROPOFOL 10 MG/ML IV BOLUS
INTRAVENOUS | Status: DC | PRN
  Administered 2015-09-16: 20 mg via INTRAVENOUS
  Administered 2015-09-16: 30 mg via INTRAVENOUS

## 2015-09-16 MED ORDER — PROPOFOL 500 MG/50ML IV EMUL
INTRAVENOUS | Status: DC | PRN
  Administered 2015-09-16: 100 ug/kg/min via INTRAVENOUS

## 2015-09-16 MED ORDER — ONDANSETRON HCL 4 MG/2ML IJ SOLN
INTRAMUSCULAR | Status: AC
  Filled 2015-09-16: qty 2

## 2015-09-16 MED ORDER — LACTATED RINGERS IV SOLN
INTRAVENOUS | Status: DC
  Administered 2015-09-16: 09:00:00 via INTRAVENOUS

## 2015-09-16 MED ORDER — PROPOFOL 10 MG/ML IV BOLUS
INTRAVENOUS | Status: AC
  Filled 2015-09-16: qty 20

## 2015-09-16 MED ORDER — PROPOFOL 10 MG/ML IV BOLUS
INTRAVENOUS | Status: AC
  Filled 2015-09-16: qty 40

## 2015-09-16 SURGICAL SUPPLY — 22 items

## 2015-09-16 NOTE — H&P (Signed)
  Shaun Stokes HPI: The patient is here today for a screening colonoscopy.  No GI complaints.  The patient has significant OSA.  Past Medical History:  Diagnosis Date  . Chest pain   . Goiter   . Hyperlipemia   . Hypertension   . Insomnia   . Obesity   . OSA (obstructive sleep apnea) 08/24/2014  . Prediabetes   . Vitamin D deficiency     Past Surgical History:  Procedure Laterality Date  . NO PAST SURGERIES      Family History  Problem Relation Age of Onset  . Heart attack Father     Social History:  reports that he quit smoking about 9 months ago. His smoking use included Cigarettes. He has a 32.00 pack-year smoking history. He has never used smokeless tobacco. He reports that he drinks about 1.8 oz of alcohol per week . He reports that he does not use drugs.  Allergies: No Known Allergies  Medications:  Scheduled:  Continuous: . sodium chloride    . lactated ringers 125 mL/hr at 09/16/15 0836    No results found for this or any previous visit (from the past 24 hour(s)).   No results found.  ROS:  As stated above in the HPI otherwise negative.  Pulse 81, temperature 97.7 F (36.5 C), temperature source Oral, resp. rate 19, height 6\' 2"  (1.88 m), weight (!) 179.2 kg (395 lb), SpO2 99 %.    PE: Gen: NAD, Alert and Oriented HEENT:  [address] [place]/AT, EOMI Neck: Supple, no LAD Lungs: CTA Bilaterally CV: RRR without M/G/R ABM: Soft, NTND, +BS Ext: No C/C/E  Assessment/Plan: 1) Screening colonoscopy. 2) OSA - to be managed by anesthesia during the procedure.  Mitchell Epling D 09/16/2015, 8:52 AM

## 2015-09-16 NOTE — Transfer of Care (Signed)
Immediate Anesthesia Transfer of Care Note  Patient: Shaun Stokes  Procedure(s) Performed: Procedure(s): COLONOSCOPY WITH PROPOFOL (N/A)  Patient Location: PACU  Anesthesia Type2:MAC  Level of Consciousness:  sedated, patient cooperative and responds to stimulation  Airway & Oxygen Therapy:Patient Spontanous Breathing and Patient connected to face mask oxgen  Post-op Assessment:  Report given to PACU RN and Post -op Vital signs reviewed and stable  Post vital signs:  Reviewed and stable  Last Vitals:  Vitals:   09/16/15 0819  Pulse: 81  Resp: 19  Temp: [id number] C    Complications: No apparent anesthesia complications

## 2015-09-16 NOTE — Op Note (Signed)
[facility] Patient Name: Shaun Stokes Procedure Date: 09/16/2015 MRN: [id number] Attending MD: Carol Ada , MD Date of Birth: 1965-09-16 CSN: [id number] Age: 50 Admit Type: Outpatient Procedure:                Colonoscopy Indications:              Screening for colorectal malignant neoplasm Providers:                Carol Ada, MD, Laverta Baltimore RN, RN, Elspeth Cho Tech., Technician, Marla Roe, CRNA Referring MD:              Medicines:                Propofol per Anesthesia Complications:            No immediate complications. Estimated Blood Loss:     Estimated blood loss: none. Procedure:                Pre-Anesthesia Assessment:                           - Prior to the procedure, a History and Physical                            was performed, and patient medications and                            allergies were reviewed. The patient's tolerance of                            previous anesthesia was also reviewed. The risks                            and benefits of the procedure and the sedation                            options and risks were discussed with the patient.                            All questions were answered, and informed consent                            was obtained. Prior Anticoagulants: The patient has                            taken no previous anticoagulant or antiplatelet                            agents. ASA Grade Assessment: III - A patient with                            severe systemic disease. After reviewing the risks  and benefits, the patient was deemed in                            satisfactory condition to undergo the procedure.                           - Sedation was administered by an anesthesia                            professional. Deep sedation was attained.                           After obtaining informed consent, the colonoscope       was passed under direct vision. Throughout the                            procedure, the patient's blood pressure, pulse, and                            oxygen saturations were monitored continuously. The                            [id number]) scope was introduced through                            the anus and advanced to the the cecum, identified                            by appendiceal orifice and ileocecal valve. The                            colonoscopy was performed without difficulty. The                            patient tolerated the procedure well. The quality                            of the bowel preparation was good. The ileocecal                            valve, appendiceal orifice, and rectum were                            photographed. Scope In: 10:00:49 AM Scope Out: 10:16:25 AM Scope Withdrawal Time: 0 hours 13 minutes 28 seconds  Total Procedure Duration: 0 hours 15 minutes 36 seconds  Findings:      A 8 mm polyp was found in the ascending colon. The polyp was       semi-sessile. The polyp was removed with a hot snare. Resection and       retrieval were complete.      A 3 mm polyp was found in the rectum. The polyp was sessile. The polyp       was removed with a cold snare. Resection and retrieval were complete. Impression:               -  One 8 mm polyp in the ascending colon, removed                            with a hot snare. Resected and retrieved.                           - One 3 mm polyp in the rectum, removed with a cold                            snare. Resected and retrieved. Moderate Sedation:      N/A- Per Anesthesia Care Recommendation:           - Patient has a contact number available for                            emergencies. The signs and symptoms of potential                            delayed complications were discussed with the                            patient. Return to normal activities tomorrow.                             Written discharge instructions were provided to the                            patient.                           - Resume previous diet.                           - Continue present medications.                           - Await pathology results.                           - Repeat colonoscopy in 3 - 5 years for                            surveillance. Procedure Code(s):        --- Professional ---                           [phone], Colonoscopy, flexible; with removal of                            tumor(s), polyp(s), or other lesion(s) by snare                            technique Diagnosis Code(s):        --- Professional ---  Z12.11, Encounter for screening for malignant                            neoplasm of colon                           D12.2, Benign neoplasm of ascending colon                           K62.1, Rectal polyp CPT copyright 2016 [employer]. All rights reserved. The codes documented in this report are preliminary and upon coder review may  be revised to meet current compliance requirements. Carol Ada, MD Carol Ada, MD 09/16/2015 10:21:23 AM This report has been signed electronically. Number of Addenda: 0

## 2015-09-16 NOTE — Anesthesia Postprocedure Evaluation (Signed)
Anesthesia Post Note  Patient: Shaun Stokes  Procedure(s) Performed: Procedure(s) (LRB): COLONOSCOPY WITH PROPOFOL (N/A)  Patient location during evaluation: PACU Anesthesia Type: MAC Level of consciousness: awake and alert Pain management: pain level controlled Vital Signs Assessment: post-procedure vital signs reviewed and stable Respiratory status: spontaneous breathing, nonlabored ventilation, respiratory function stable and patient connected to nasal cannula oxygen Cardiovascular status: stable and blood pressure returned to baseline Anesthetic complications: no    Last Vitals:  Vitals:   09/16/15 1037 09/16/15 1047  BP: 129/85 129/75  Pulse: 87 83  Resp: (!) 23 16  Temp: 36.4 C     Last Pain:  Vitals:   09/16/15 1047  TempSrc:   PainSc: 0-No pain                 Reginal Lutes

## 2015-09-16 NOTE — OR Nursing (Signed)
Pt. Noted to have a small circular open area on the back of his left upper leg.  Area assessed was clean.  Area covered prior to procedure with gauze and hypofix tape.  Dr. Benson Norway aware.    Laverta Baltimore, RN

## 2015-09-19 ENCOUNTER — Encounter (HOSPITAL_COMMUNITY): Payer: Self-pay | Admitting: Gastroenterology

## 2017-04-08 ENCOUNTER — Encounter (HOSPITAL_COMMUNITY): Payer: Self-pay | Admitting: Emergency Medicine

## 2017-04-08 ENCOUNTER — Emergency Department (HOSPITAL_COMMUNITY)
Admission: EM | Admit: 2017-04-08 | Discharge: 2017-04-09 | Disposition: A | Discharge status: Home / Self Care | Payer: BLUE CROSS/BLUE SHIELD | Attending: Emergency Medicine | Admitting: Emergency Medicine

## 2017-04-08 ENCOUNTER — Other Ambulatory Visit: Payer: Self-pay

## 2017-04-08 ENCOUNTER — Encounter (HOSPITAL_COMMUNITY): Payer: Self-pay

## 2017-04-08 ENCOUNTER — Emergency Department (HOSPITAL_COMMUNITY): Payer: BLUE CROSS/BLUE SHIELD

## 2017-04-08 ENCOUNTER — Ambulatory Visit (HOSPITAL_COMMUNITY)
Admission: EM | Admit: 2017-04-08 | Discharge: 2017-04-08 | Disposition: A | Discharge status: Nursing Home (Includes ICF) | Payer: BLUE CROSS/BLUE SHIELD | Attending: Family Medicine | Admitting: Family Medicine

## 2017-04-08 DIAGNOSIS — R079 Chest pain, unspecified: Secondary | ICD-10-CM

## 2017-04-08 DIAGNOSIS — Z87891 Personal history of nicotine dependence: Secondary | ICD-10-CM | POA: Diagnosis not present

## 2017-04-08 DIAGNOSIS — Z79899 Other long term (current) drug therapy: Secondary | ICD-10-CM | POA: Insufficient documentation

## 2017-04-08 DIAGNOSIS — R42 Dizziness and giddiness: Secondary | ICD-10-CM

## 2017-04-08 DIAGNOSIS — R0789 Other chest pain: Secondary | ICD-10-CM | POA: Diagnosis not present

## 2017-04-08 DIAGNOSIS — I10 Essential (primary) hypertension: Secondary | ICD-10-CM | POA: Insufficient documentation

## 2017-04-08 LAB — BASIC METABOLIC PANEL
ANION GAP: 14 (ref 5–15)
BUN: 8 mg/dL (ref 6–20)
CALCIUM: 9.2 mg/dL (ref 8.9–10.3)
CO2: 20 mmol/L — ABNORMAL LOW (ref 22–32)
Chloride: 104 mmol/L (ref 101–111)
Creatinine, Ser: 0.85 mg/dL (ref 0.61–1.24)
GFR calc Af Amer: >60 mL/min (ref >60)
GLUCOSE: 89 mg/dL (ref 65–99)
Potassium: 4.5 mmol/L (ref 3.5–5.1)
Sodium: 138 mmol/L (ref 135–145)

## 2017-04-08 LAB — CBC WITH DIFFERENTIAL/PLATELET
BASOS ABS: 0 10*3/uL (ref 0.0–0.1)
Basophils Relative: 0 %
Eosinophils Absolute: 0.1 10*3/uL (ref 0.0–0.7)
Eosinophils Relative: 1 %
HEMATOCRIT: 41.2 % (ref 39.0–52.0)
Hemoglobin: 14.1 g/dL (ref 13.0–17.0)
Lymphocytes Relative: 25 %
Lymphs Abs: 2.6 10*3/uL (ref 0.7–4.0)
MCH: 32.2 pg (ref 26.0–34.0)
MCHC: 34.2 g/dL (ref 30.0–36.0)
MCV: 94.1 fL (ref 78.0–100.0)
MONO ABS: 0.5 10*3/uL (ref 0.1–1.0)
MONOS PCT: 5 %
NEUTROS ABS: 7 10*3/uL (ref 1.7–7.7)
Neutrophils Relative %: 69 %
Platelets: 255 10*3/uL (ref 150–400)
RBC: 4.38 MIL/uL (ref 4.22–5.81)
RDW: 14.7 % (ref 11.5–15.5)
WBC: 10.1 10*3/uL (ref 4.0–10.5)

## 2017-04-08 LAB — I-STAT TROPONIN, ED: Troponin i, poc: 0.01 ng/mL (ref 0.00–0.08)

## 2017-04-08 LAB — BRAIN NATRIURETIC PEPTIDE: B NATRIURETIC PEPTIDE 5: 53.5 pg/mL (ref 0.0–100.0)

## 2017-04-08 NOTE — ED Notes (Signed)
Paged neuro -[place] to [employer] at [place], [place], [place]

## 2017-04-08 NOTE — ED Provider Notes (Signed)
  Patient placed in Quick Look pathway, seen and evaluated for chief complaint of chest pain sent from urgent care.  Pertinent H&P findings include Speech is clear and coherent, non-toxic appearing, no respiratory distress.  Based on initial evaluation, labs are indicated and radiology studies are indicated.  Patient counseled on process, plan, and necessity for staying for completing the evaluation.    Waynetta Pean, PA-C 04/08/17 [place], MD 04/08/17 [phone]

## 2017-04-08 NOTE — ED Triage Notes (Signed)
Pt presents to the ed with complaints of chest pressure and dizziness while at work so he went to urgent care and they sent him here. nad in triage. Denies any other symptoms at this time.

## 2017-04-08 NOTE — ED Provider Notes (Addendum)
[facility]    CSN: [id number] Arrival date & time: 04/08/17  1148     History   Chief Complaint Chief Complaint  Patient presents with  . Dizziness    HPI Shaun Stokes is a 52 y.o. male.   Shaun Stokes presents with complaints of left chest discomfort and dizziness which started this morning while he was at work. He is a Furniture conservator/restorer. He states he felt like his heart was beating fast. He still has some discomfort and dizziness currently. Denies any diaphoresis, arm or jaw pain. No longer smokes. Does drink approximately 1 pint of alcohol a day. States has had similar in the past due to CHF. Bilateral ankle swelling, states this is normal for him. Denies shortness of breath, cough or URI symptoms. Has not taken any medications for symptoms. He thinks he had a stress test last 6 months ago but does not know the result. Does not follow up with a cardiologist regularly. Hx htn, prediabetes, obesity, osa.   Per chart review does appear patient presented in 04/2015 multiple times with evaluation completed.     ROS per HPI.       Past Medical History:  Diagnosis Date  . Chest pain   . Goiter   . Hyperlipemia   . Hypertension   . Insomnia   . Obesity   . OSA (obstructive sleep apnea) 08/24/2014  . Prediabetes   . Vitamin D deficiency     Patient Active Problem List   Diagnosis Date Noted  . Essential hypertension 04/27/2015  . Dyspnea 04/26/2015  . OSA (obstructive sleep apnea) 08/24/2014  . Streptococcal sore throat 06/04/2014  . Pharyngitis 06/02/2014  . Leukocytosis 06/02/2014  . Severe obesity (BMI >= 40) ([place]) 06/02/2014  . Pharyngitis, acute 06/02/2014  . Prediabetes   . Vitamin D deficiency     Past Surgical History:  Procedure Laterality Date  . COLONOSCOPY WITH PROPOFOL N/A 09/16/2015   Procedure: COLONOSCOPY WITH PROPOFOL;  Surgeon: Carol Ada, MD;  Location: [facility] ENDOSCOPY;  Service: Endoscopy;  Laterality: N/A;  . NO PAST SURGERIES         Home  Medications    Prior to Admission medications   Medication Sig Start Date End Date Taking? Authorizing Provider  acetaminophen (TYLENOL) 500 MG tablet Take 500 mg by mouth every 4 (four) hours as needed for mild pain or moderate pain.     [provider]  carvedilol (COREG) 3.125 MG tablet Take 3.125 mg by mouth 2 (two) times daily. 08/23/15   [provider]  citalopram (CELEXA) 40 MG tablet Take 40 mg by mouth daily.    [provider]  diclofenac (VOLTAREN) 75 MG EC tablet Take 75 mg by mouth 2 (two) times daily. 08/23/15   [provider]  furosemide (LASIX) 40 MG tablet Take 40 mg by mouth daily. 08/23/15   [provider]  losartan (COZAAR) 25 MG tablet Take 1 tablet (25 mg total) by mouth daily. 04/12/15   Fatima Blank, MD  naproxen (NAPROSYN) 375 MG tablet Take 1 tablet (375 mg total) by mouth 2 (two) times daily. 09/06/15   Montine Circle, PA-C  nebivolol (BYSTOLIC) 5 MG tablet Take 1 tablet (5 mg total) by mouth daily. 04/26/15   Tanda Rockers, MD  RA VITAMIN D-3 1000 units tablet Take 1,000 Units by mouth daily. 08/23/15   [provider]  traZODone (DESYREL) 50 MG tablet Take 1 tablet (50 mg total) by mouth at bedtime. 04/26/15  Tanda Rockers, MD    Family History Family History  Problem Relation Age of Onset  . Heart attack Father     Social History Social History   Tobacco Use  . Smoking status: Former Smoker    Packs/day: 1.00    Years: 32.00    Pack years: 32.00    Types: Cigarettes    Last attempt to quit: 11/18/2014    Years since quitting: 2.3  . Smokeless tobacco: Never Used  Substance Use Topics  . Alcohol use: Yes    Alcohol/week: 1.8 oz    Types: 3 Shots of liquor per week    Comment: 2-3 drinks twice week  . Drug use: No     Allergies   Patient has no known allergies.   Review of Systems Review of Systems   Physical Exam Triage Vital Signs ED Triage Vitals  Enc Vitals Group      BP 04/08/17 1343 (!) 163/89     Pulse Rate 04/08/17 1343 92     Resp 04/08/17 1343 (!) 24     Temp 04/08/17 1343 98.1 F (36.7 C)     Temp Source 04/08/17 1343 Oral     SpO2 04/08/17 1343 100 %     Weight --      Height --      Head Circumference --      Peak Flow --      Pain Score 04/08/17 1341 2     Pain Loc --      Pain Edu? --      Excl. in [place]? --    No data found.  Updated Vital Signs BP (!) 163/89 (BP Location: Left Arm) Comment (BP Location): large cuff  Pulse 92   Temp 98.1 F (36.7 C) (Oral)   Resp (!) 24   SpO2 100%   Visual Acuity Right Eye Distance:   Left Eye Distance:   Bilateral Distance:    Right Eye Near:   Left Eye Near:    Bilateral Near:     Physical Exam  Constitutional: He is oriented to person, place, and time. He appears well-developed and well-nourished.  Cardiovascular: Normal rate and regular rhythm.  Bilateral ankles with +1 pitting edema  Pulmonary/Chest: Effort normal and breath sounds normal.  Neurological: He is alert and oriented to person, place, and time.  Skin: Skin is warm and dry.   EKG nsr without acute abnormalities noted.   UC Treatments / Results  Labs (all labs ordered are listed, but only abnormal results are displayed) Labs Reviewed - No data to display  EKG  EKG Interpretation None       Radiology No results found.  Procedures Procedures (including critical care time)  Medications Ordered in UC Medications - No data to display   Initial Impression / Assessment and Plan / UC Course  I have reviewed the triage vital signs and the nursing notes.  Pertinent labs & imaging results that were available during my care of the patient were reviewed by me and considered in my medical decision making (see chart for details).     Patient non toxic in appearance. ekg without acute changes, reassuring at this time. Persistent symptoms however, with cardiac history. Recommended further cardiac evaluation in ER.  Patient verbalized understanding and agreeable to plan.    Final Clinical Impressions(s) / UC Diagnoses   Final diagnoses:  Chest pain, unspecified type  Dizziness    ED Discharge Orders    None  Controlled Substance Prescriptions [place] Controlled Substance Registry consulted? Not Applicable   Zigmund Gottron, NP 04/08/17 1407    Augusto Gamble B, NP 04/08/17 [phone]

## 2017-04-08 NOTE — ED Triage Notes (Signed)
Tightness in chest and dizziness started at work this morning, around 8:00 am this morning.  Continues to have "discomfort " in left chest.  Denies sob.  Denies any unusual diaphoresis

## 2017-04-08 NOTE — Discharge Instructions (Signed)
Your EKG is reassuring however due to your history I recommend more complete cardiac evaluation in the ER at this time due to persistent symptoms.

## 2017-04-09 LAB — I-STAT TROPONIN, ED: TROPONIN I, POC: 0 ng/mL (ref 0.00–0.08)

## 2017-04-09 MED ORDER — LOSARTAN POTASSIUM 50 MG PO TABS: 50.0000 mg | ORAL_TABLET | Freq: Every day | ORAL | 3 refills | Status: DC

## 2017-04-09 MED ORDER — LABETALOL HCL 5 MG/ML IV SOLN
10.0000 mg | Freq: Once | INTRAVENOUS | Status: AC
  Administered 2017-04-09: 10 mg via INTRAVENOUS
  Filled 2017-04-09: qty 4

## 2017-04-09 NOTE — ED Provider Notes (Signed)
[place] EMERGENCY DEPARTMENT Provider Note   CSN: [id number] Arrival date & time: 04/08/17  1445     History   Chief Complaint Chief Complaint  Patient presents with  . Chest Pain    HPI Shaun Stokes is a 52 y.o. male.  Patient presents to the emergency department for evaluation of chest pain.  He reports that the pain began around 8 AM this morning while.  He was not exerting himself when pain began.  He noticed dizziness associated with the pain, has not had any nausea or diaphoresis.  He does endorse mild shortness of breath but this is somewhat chronic for him, no change.  He does have chronic lower extremity swelling, does not feel like he is more swollen than his baseline.  Patient does endorse daily alcohol intake.  He does not use any drugs, specifically denies cocaine.      Past Medical History:  Diagnosis Date  . Chest pain   . Goiter   . Hyperlipemia   . Hypertension   . Insomnia   . Obesity   . OSA (obstructive sleep apnea) 08/24/2014  . Prediabetes   . Vitamin D deficiency     Patient Active Problem List   Diagnosis Date Noted  . Essential hypertension 04/27/2015  . Dyspnea 04/26/2015  . OSA (obstructive sleep apnea) 08/24/2014  . Streptococcal sore throat 06/04/2014  . Pharyngitis 06/02/2014  . Leukocytosis 06/02/2014  . Severe obesity (BMI >= 40) ([place]) 06/02/2014  . Pharyngitis, acute 06/02/2014  . Prediabetes   . Vitamin D deficiency     Past Surgical History:  Procedure Laterality Date  . COLONOSCOPY WITH PROPOFOL N/A 09/16/2015   Procedure: COLONOSCOPY WITH PROPOFOL;  Surgeon: Carol Ada, MD;  Location: [facility];  Service: Endoscopy;  Laterality: N/A;  . NO PAST SURGERIES         Home Medications    Prior to Admission medications   Medication Sig Start Date End Date Taking? Authorizing Provider  acetaminophen (TYLENOL) 500 MG tablet Take 500 mg by mouth every 4 (four) hours as needed for mild pain or moderate  pain.    Yes [provider]  carvedilol (COREG) 3.125 MG tablet Take 3.125 mg by mouth 2 (two) times daily. 08/23/15  Yes [provider]  citalopram (CELEXA) 40 MG tablet Take 40 mg by mouth daily.   Yes [provider]  diclofenac (VOLTAREN) 75 MG EC tablet Take 75 mg by mouth 2 (two) times daily. 08/23/15  Yes [provider]  furosemide (LASIX) 40 MG tablet Take 40 mg by mouth daily. 08/23/15  Yes [provider]  losartan (COZAAR) 25 MG tablet Take 1 tablet (25 mg total) by mouth daily. 04/12/15  Yes Cardama, Grayce Sessions, MD  nebivolol (BYSTOLIC) 5 MG tablet Take 1 tablet (5 mg total) by mouth daily. 04/26/15  Yes Tanda Rockers, MD  RA VITAMIN D-3 1000 units tablet Take 1,000 Units by mouth daily. 08/23/15  Yes [provider]  traZODone (DESYREL) 50 MG tablet Take 1 tablet (50 mg total) by mouth at bedtime. 04/26/15  Yes Tanda Rockers, MD  naproxen (NAPROSYN) 375 MG tablet Take 1 tablet (375 mg total) by mouth 2 (two) times daily. Patient not taking: Reported on 04/09/2017 09/06/15   Montine Circle, PA-C    Family History Family History  Problem Relation Age of Onset  . Heart attack Father     Social History Social History   Tobacco Use  .  Smoking status: Former Smoker    Packs/day: 1.00    Years: 32.00    Pack years: 32.00    Types: Cigarettes    Last attempt to quit: 11/18/2014    Years since quitting: 2.3  . Smokeless tobacco: Never Used  Substance Use Topics  . Alcohol use: Yes    Alcohol/week: 1.8 oz    Types: 3 Shots of liquor per week    Comment: 2-3 drinks twice week  . Drug use: No     Allergies   Patient has no known allergies.   Review of Systems Review of Systems  Respiratory: Positive for shortness of breath.   Cardiovascular: Positive for chest pain and leg swelling.  Neurological: Positive for dizziness.  All other systems reviewed and are negative.    Physical Exam Updated Vital Signs BP  (!) 158/94   Pulse 79   Temp 97.8 F (36.6 C) (Oral)   Resp 15   Wt (!) 179.2 kg (395 lb)   SpO2 100%   BMI 50.71 kg/m   Physical Exam  Constitutional: He is oriented to person, place, and time. He appears well-developed and well-nourished. No distress.  HENT:  Head: Normocephalic and atraumatic.  Right Ear: Hearing normal.  Left Ear: Hearing normal.  Nose: Nose normal.  Mouth/Throat: Oropharynx is clear and moist and mucous membranes are normal.  Eyes: Conjunctivae and EOM are normal. Pupils are equal, round, and reactive to light.  Neck: Normal range of motion. Neck supple.  Cardiovascular: Regular rhythm, S1 normal and S2 normal. Exam reveals no gallop and no friction rub.  No murmur heard. Pulmonary/Chest: Effort normal and breath sounds normal. No respiratory distress. He exhibits no tenderness.  Abdominal: Soft. Normal appearance and bowel sounds are normal. There is no hepatosplenomegaly. There is no tenderness. There is no rebound, no guarding, no tenderness at McBurney's point and negative Murphy's sign. No hernia.  Musculoskeletal: Normal range of motion.       Right lower leg: He exhibits edema.       Left lower leg: He exhibits edema.  Neurological: He is alert and oriented to person, place, and time. He has normal strength. No cranial nerve deficit or sensory deficit. Coordination normal. GCS eye subscore is 4. GCS verbal subscore is 5. GCS motor subscore is 6.  Skin: Skin is warm, dry and intact. No rash noted. No cyanosis.  Psychiatric: He has a normal mood and affect. His speech is normal and behavior is normal. Thought content normal.  Nursing note and vitals reviewed.    ED Treatments / Results  Labs (all labs ordered are listed, but only abnormal results are displayed) Labs Reviewed  BASIC METABOLIC PANEL - Abnormal; Notable for the following components:      Result Value   CO2 20 (*)    All other components within normal limits  CBC WITH  DIFFERENTIAL/PLATELET  BRAIN NATRIURETIC PEPTIDE  CBC  I-STAT TROPONIN, ED  I-STAT TROPONIN, ED    EKG  EKG Interpretation  Date/Time:  Tuesday April 09 2017 00:50:09 EST Ventricular Rate:  83 PR Interval:    QRS Duration: 97 QT Interval:  385 QTC Calculation: 453 R Axis:   36 Text Interpretation:  Sinus rhythm Low voltage, precordial leads Probable anteroseptal infarct, old No significant change since last tracing Confirmed by Orpah Greek [phone]) on 04/09/2017 1:39:28 AM       Radiology Dg Chest 2 View  Result Date: 04/08/2017 CLINICAL DATA:  Chest pain. EXAM: CHEST  2 VIEW COMPARISON:  Radiographs of April 25, 2015. FINDINGS: The heart size and mediastinal contours are within normal limits. Stable mild central pulmonary vascular congestion noted. No pneumothorax or pleural effusion is noted. No consolidative process is noted. The visualized skeletal structures are unremarkable. IMPRESSION: Stable mild central pulmonary vascular congestion. No other abnormality seen in the chest. Electronically Signed   By: Marijo Conception, M.D.   On: 04/08/2017 15:52    Procedures Procedures (including critical care time)  Medications Ordered in ED Medications  labetalol (NORMODYNE,TRANDATE) injection 10 mg (10 mg Intravenous Given 04/09/17 0058)     Initial Impression / Assessment and Plan / ED Course  I have reviewed the triage vital signs and the nursing notes.  Pertinent labs & imaging results that were available during my care of the patient were reviewed by me and considered in my medical decision making (see chart for details).     Patient presents for evaluation of chest pain.  Patient has been experiencing pain for most of the day.  Pain began while he was at rest and has not changed with exertion.  He feels slightly short of breath, but this is his normal baseline, no change.  He has swelling of both of his legs, but this is also at his baseline and unchanged.  This  is consistent with peripheral edema, as he has a normal BNP.  He does not appear volume overloaded.  He is breathing comfortably, no crackles or sequela of CHF.  She has multiple cardiac risk factors, however his heart score is 3, low risk.  He was very hypertensive at arrival.  He takes antihypertensives, has not missed any doses.  He was given labetalol with immediate improvement of his blood pressure.  His discomfort as well as his dizziness has resolved now that he is normotensive.  Patient is therefore appropriate for outpatient follow-up with PCP.  Final Clinical Impressions(s) / ED Diagnoses   Final diagnoses:  Atypical chest pain  Essential hypertension    ED Discharge Orders    None       Sparrow [place], Gwenyth Allegra, MD 04/09/17 [phone]

## 2017-04-09 NOTE — Discharge Instructions (Addendum)
Increase Losartan (Cozaar) to 50mg  daily. See your doctor within one week for recheck. Return to ER for persistently elevated blood pressure or chest pain.

## 2017-05-28 IMAGING — DX DG CHEST 2V
2 series · 2 of 2 positions shown · non-contrast
Comparison: 01/24/2014

CLINICAL DATA: Heart palpitations today, ex-smoker. No other chest
complaints.

EXAM:
CHEST  2 VIEW

[w chest pa]
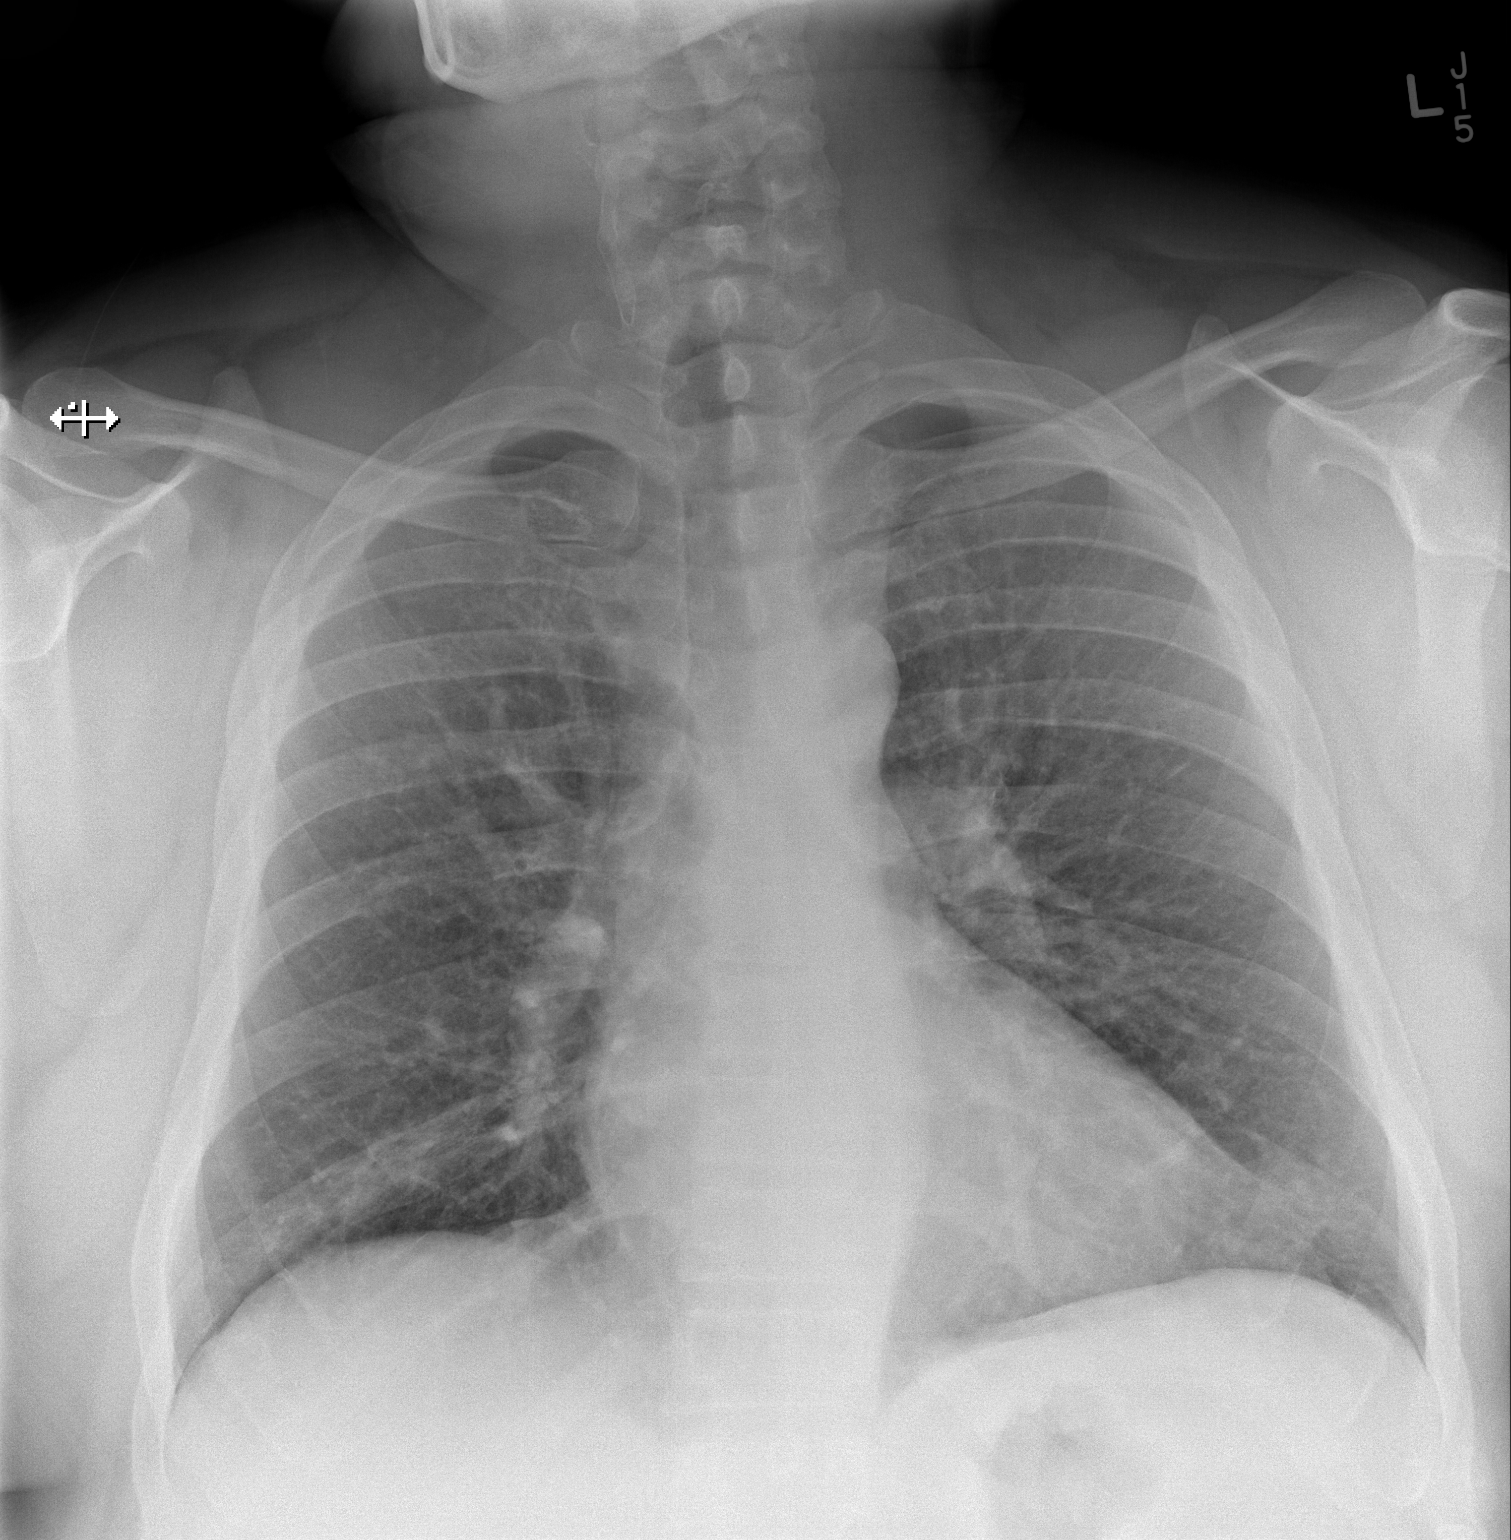

[w chest lat]
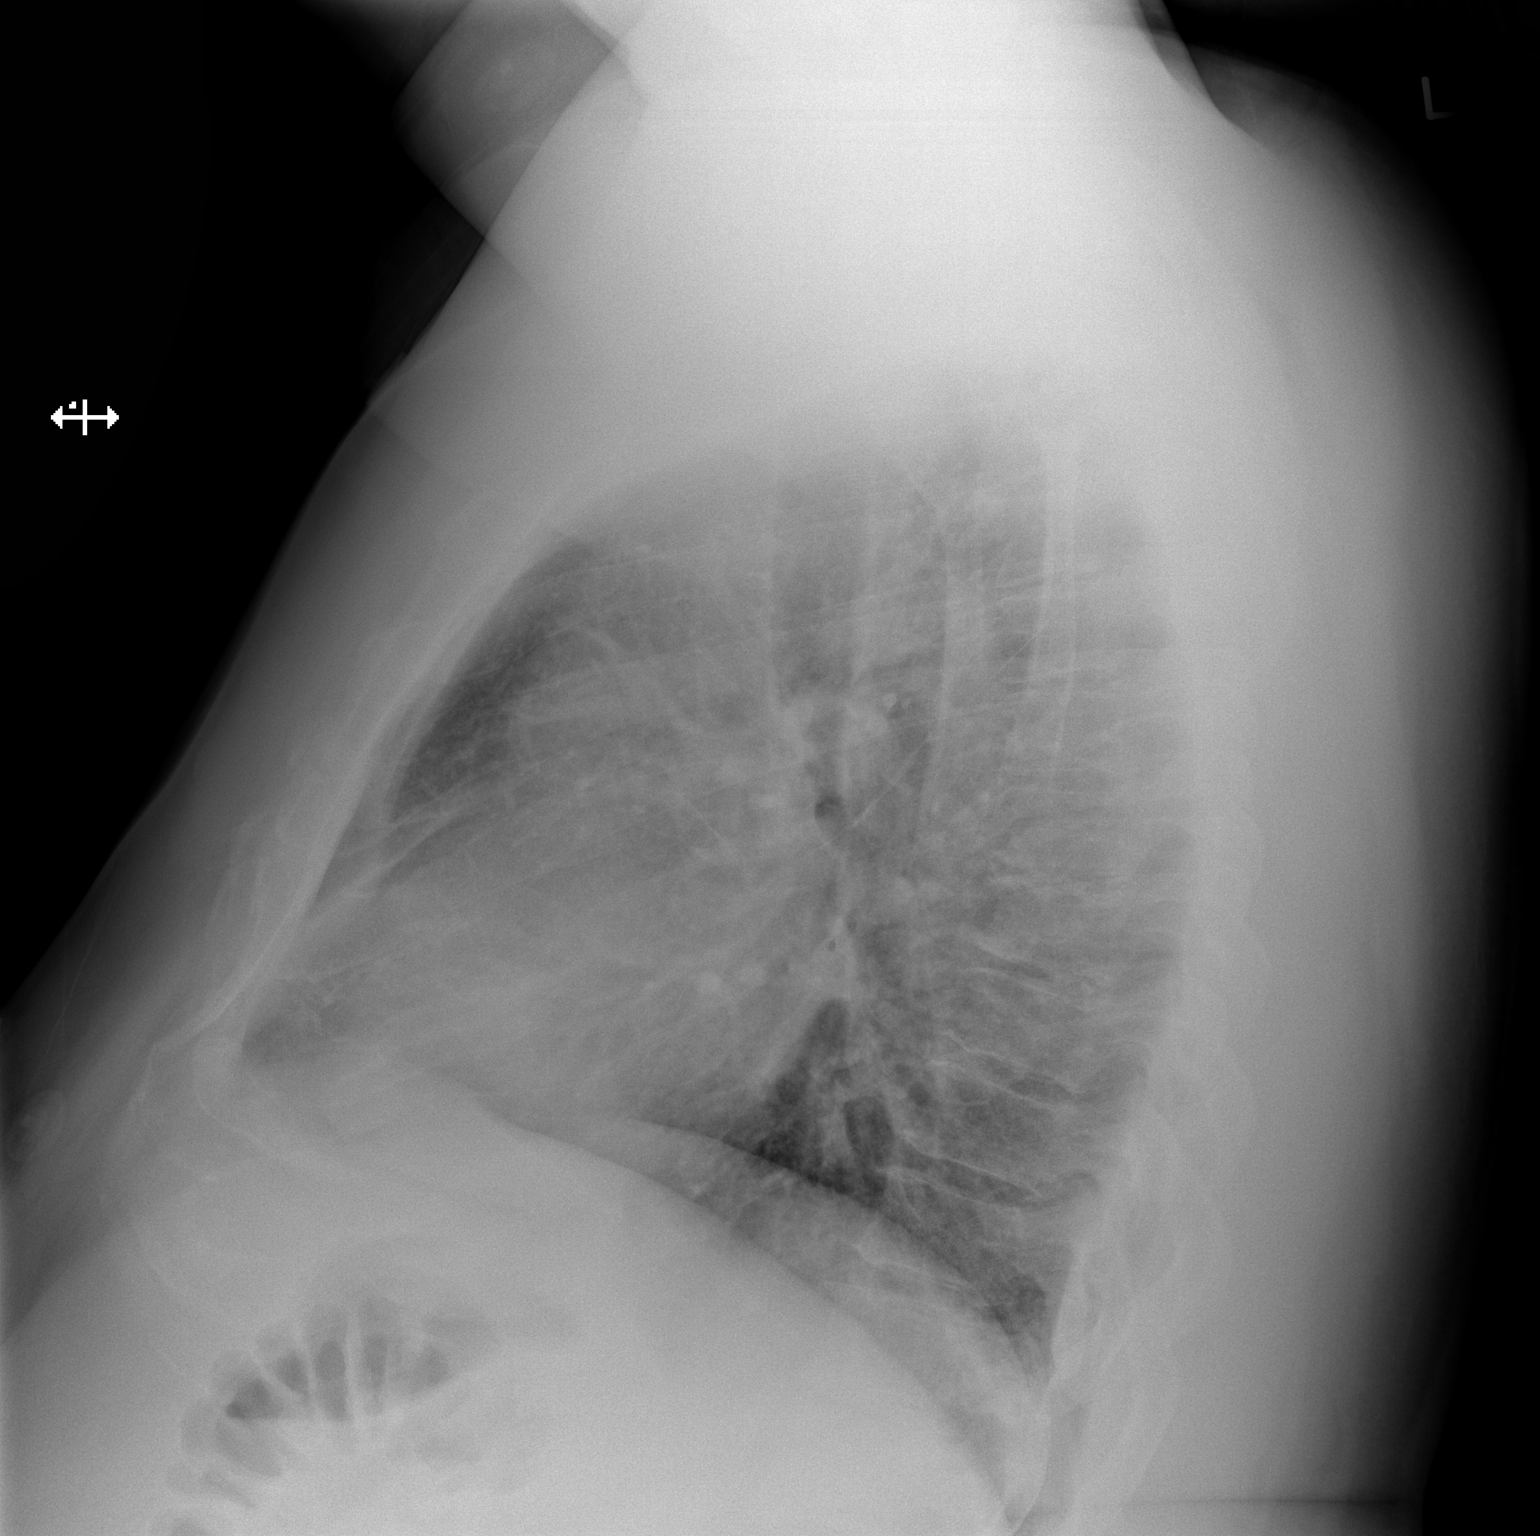

[2 of 2 positions shown; findings below may reference images not displayed]

FINDINGS: The heart size and mediastinal contours are within normal limits.
Both lungs are clear. No pleural effusion or pneumothorax. The
visualized skeletal structures are unremarkable.
IMPRESSION: No active cardiopulmonary disease.

## 2017-08-12 ENCOUNTER — Emergency Department (HOSPITAL_COMMUNITY): Payer: BLUE CROSS/BLUE SHIELD

## 2017-08-12 ENCOUNTER — Emergency Department (HOSPITAL_COMMUNITY)
Admission: EM | Admit: 2017-08-12 | Discharge: 2017-08-12 | Disposition: A | Discharge status: Home / Self Care | Payer: BLUE CROSS/BLUE SHIELD | Attending: Emergency Medicine | Admitting: Emergency Medicine

## 2017-08-12 ENCOUNTER — Encounter (HOSPITAL_COMMUNITY): Payer: Self-pay | Admitting: Emergency Medicine

## 2017-08-12 DIAGNOSIS — I509 Heart failure, unspecified: Secondary | ICD-10-CM | POA: Insufficient documentation

## 2017-08-12 DIAGNOSIS — I110 Hypertensive heart disease with heart failure: Secondary | ICD-10-CM | POA: Diagnosis not present

## 2017-08-12 DIAGNOSIS — R0602 Shortness of breath: Secondary | ICD-10-CM | POA: Diagnosis present

## 2017-08-12 DIAGNOSIS — Z79899 Other long term (current) drug therapy: Secondary | ICD-10-CM | POA: Diagnosis not present

## 2017-08-12 DIAGNOSIS — Z87891 Personal history of nicotine dependence: Secondary | ICD-10-CM | POA: Insufficient documentation

## 2017-08-12 LAB — BASIC METABOLIC PANEL
Anion gap: 11 (ref 5–15)
BUN: 9 mg/dL (ref 6–20)
CHLORIDE: 110 mmol/L (ref 98–111)
CO2: 21 mmol/L — ABNORMAL LOW (ref 22–32)
CREATININE: 0.78 mg/dL (ref 0.61–1.24)
Calcium: 8.7 mg/dL — ABNORMAL LOW (ref 8.9–10.3)
GFR calc Af Amer: >60 mL/min (ref >60)
GFR calc non Af Amer: >60 mL/min (ref >60)
GLUCOSE: 110 mg/dL — AB (ref 70–99)
POTASSIUM: 4 mmol/L (ref 3.5–5.1)
Sodium: 142 mmol/L (ref 135–145)

## 2017-08-12 LAB — CBC
HEMATOCRIT: 40.8 % (ref 39.0–52.0)
Hemoglobin: 13.4 g/dL (ref 13.0–17.0)
MCH: 32.2 pg (ref 26.0–34.0)
MCHC: 32.8 g/dL (ref 30.0–36.0)
MCV: 98.1 fL (ref 78.0–100.0)
PLATELETS: 250 10*3/uL (ref 150–400)
RBC: 4.16 MIL/uL — ABNORMAL LOW (ref 4.22–5.81)
RDW: 14 % (ref 11.5–15.5)
WBC: 10.6 10*3/uL — ABNORMAL HIGH (ref 4.0–10.5)

## 2017-08-12 LAB — BRAIN NATRIURETIC PEPTIDE: B NATRIURETIC PEPTIDE 5: 156.2 pg/mL — AB (ref 0.0–100.0)

## 2017-08-12 LAB — I-STAT TROPONIN, ED: Troponin i, poc: 0 ng/mL (ref 0.00–0.08)

## 2017-08-12 MED ORDER — FUROSEMIDE 10 MG/ML IJ SOLN
40.0000 mg | Freq: Once | INTRAMUSCULAR | Status: AC
  Administered 2017-08-12: 40 mg via INTRAVENOUS
  Filled 2017-08-12: qty 4

## 2017-08-12 NOTE — Discharge Instructions (Addendum)
For today take a dose of your Lasix with dinner.  Tomorrow take Lasix in the morning and in the evening.  On Wednesday go back to your normal Lasix regimen.  Please follow-up with your primary care provider for close follow-up.

## 2017-08-12 NOTE — ED Triage Notes (Signed)
Pt c/o shob x 2 hours, pt states he is compliant with lasix and has not gained weight recently. Pt denies pain.

## 2017-08-12 NOTE — ED Provider Notes (Signed)
[facility] EMERGENCY DEPARTMENT Provider Note  CSN: [id number] Arrival date & time: 08/12/17 0102  Chief Complaint(s) Shortness of Breath  HPI Shaun Stokes is a 52 y.o. male with a reported history of CHF on Lasix who presents to the emergency department with shortness of breath.  Patient reports that this began approximately 4 hours prior to arrival.  States that he was trying to go to bed and felt short of breath on lying down.  This lasted for approximately 1 hour.  Improved while waiting in the emergency department.  Denied any associated chest pain.  No cough.  No fevers or chills.  No nausea or vomiting.  No abdominal pain.  States that he has been compliant with his Lasix and takes it once daily.  Does endorse lower extremity edema.  HPI  Past Medical History Past Medical History:  Diagnosis Date  . Chest pain   . Goiter   . Hyperlipemia   . Hypertension   . Insomnia   . Obesity   . OSA (obstructive sleep apnea) 08/24/2014  . Prediabetes   . Vitamin D deficiency    Patient Active Problem List   Diagnosis Date Noted  . Essential hypertension 04/27/2015  . Dyspnea 04/26/2015  . OSA (obstructive sleep apnea) 08/24/2014  . Streptococcal sore throat 06/04/2014  . Pharyngitis 06/02/2014  . Leukocytosis 06/02/2014  . Severe obesity (BMI >= 40) ([place]) 06/02/2014  . Pharyngitis, acute 06/02/2014  . Prediabetes   . Vitamin D deficiency    Home Medication(s) Prior to Admission medications   Medication Sig Start Date End Date Taking? Authorizing Provider  acetaminophen (TYLENOL) 500 MG tablet Take 500 mg by mouth every 4 (four) hours as needed for mild pain or moderate pain.    Yes [provider]  aspirin EC 325 MG tablet Take 325 mg by mouth daily as needed (for headaches).    Yes [provider]  carvedilol (COREG) 3.125 MG tablet Take 3.125 mg by mouth 2 (two) times daily. 08/23/15  Yes [provider]  furosemide (LASIX) 40 MG  tablet Take 40 mg by mouth daily as needed for fluid or edema.  08/23/15  Yes [provider]  losartan (COZAAR) 25 MG tablet Take 25 mg by mouth daily. 07/18/17  Yes [provider]  naproxen sodium (ALEVE) 220 MG tablet Take 220 mg by mouth 2 (two) times daily as needed (for pain).   Yes [provider]  RA VITAMIN D-3 1000 units tablet Take 1,000 Units by mouth daily. 08/23/15  Yes [provider]  sertraline (ZOLOFT) 50 MG tablet Take 50 mg by mouth daily.  07/18/17  Yes [provider]  traZODone (DESYREL) 100 MG tablet Take 200 mg by mouth at bedtime. 07/18/17  Yes [provider]  diclofenac (VOLTAREN) 75 MG EC tablet Take 75 mg by mouth 2 (two) times daily. 08/23/15   [provider]  losartan (COZAAR) 50 MG tablet Take 1 tablet (50 mg total) by mouth daily. Patient not taking: Reported on 08/12/2017 04/09/17   Orpah Greek, MD  naproxen (NAPROSYN) 375 MG tablet Take 1 tablet (375 mg total) by mouth 2 (two) times daily. Patient not taking: Reported on 08/12/2017 09/06/15   Montine Circle, PA-C  nebivolol (BYSTOLIC) 5 MG tablet Take 1 tablet (5 mg total) by mouth daily. Patient not taking: Reported on 08/12/2017 04/26/15   Tanda Rockers, MD  traZODone (DESYREL) 50 MG tablet Take 1 tablet (50 mg total) by  mouth at bedtime. Patient not taking: Reported on 08/12/2017 04/26/15   Tanda Rockers, MD                                                                                                                                    Past Surgical History Past Surgical History:  Procedure Laterality Date  . COLONOSCOPY WITH PROPOFOL N/A 09/16/2015   Procedure: COLONOSCOPY WITH PROPOFOL;  Surgeon: Carol Ada, MD;  Location: [facility];  Service: Endoscopy;  Laterality: N/A;  . NO PAST SURGERIES     Family History Family History  Problem Relation Age of Onset  . Heart attack Father     Social History Social History   Tobacco Use  .  Smoking status: Former Smoker    Packs/day: 1.00    Years: 32.00    Pack years: 32.00    Types: Cigarettes    Last attempt to quit: 11/18/2014    Years since quitting: 2.7  . Smokeless tobacco: Never Used  Substance Use Topics  . Alcohol use: Yes    Alcohol/week: 1.8 oz    Types: 3 Shots of liquor per week    Comment: 2-3 drinks twice week  . Drug use: No   Allergies Patient has no known allergies.  Review of Systems Review of Systems All other systems are reviewed and are negative for acute change except as noted in the HPI  Physical Exam Vital Signs  I have reviewed the triage vital signs BP (!) 159/99 (BP Location: Right Arm)   Pulse 88   Temp 97.9 F (36.6 C) (Oral)   Resp 18   Ht 6\' 2"  (1.88 m)   Wt (!) 165.6 kg (365 lb)   SpO2 98%   BMI 46.86 kg/m   Physical Exam  Constitutional: He is oriented to person, place, and time. He appears well-developed and well-nourished. No distress.  HENT:  Head: Normocephalic and atraumatic.  Nose: Nose normal.  Eyes: Pupils are equal, round, and reactive to light. Conjunctivae and EOM are normal. Right eye exhibits no discharge. Left eye exhibits no discharge. No scleral icterus.  Neck: Normal range of motion. Neck supple.  Cardiovascular: Normal rate and regular rhythm. Exam reveals no gallop and no friction rub.  No murmur heard. Pulmonary/Chest: Effort normal and breath sounds normal. No stridor. No respiratory distress. He has no rales.  Abdominal: Soft. He exhibits no distension. There is no tenderness.  Musculoskeletal: He exhibits no edema or tenderness.  BLE edema  Neurological: He is alert and oriented to person, place, and time.  Skin: Skin is warm and dry. No rash noted. He is not diaphoretic. No erythema.  Psychiatric: He has a normal mood and affect.  Vitals reviewed.   ED Results and Treatments Labs (all labs ordered are listed, but only abnormal results are displayed) Labs Reviewed  BASIC METABOLIC PANEL -  Abnormal; Notable for the following components:  Result Value   CO2 21 (*)    Glucose, Bld 110 (*)    Calcium 8.7 (*)    All other components within normal limits  CBC - Abnormal; Notable for the following components:   WBC 10.6 (*)    RBC 4.16 (*)    All other components within normal limits  BRAIN NATRIURETIC PEPTIDE - Abnormal; Notable for the following components:   B Natriuretic Peptide 156.2 (*)    All other components within normal limits  I-STAT TROPONIN, ED                                                                                                                         EKG  EKG Interpretation  Date/Time:  Monday August 12 2017 01:10:32 EDT Ventricular Rate:  105 PR Interval:  174 QRS Duration: 72 QT Interval:  340 QTC Calculation: 449 R Axis:   4 Text Interpretation:  Sinus tachycardia Low voltage QRS Cannot rule out Anterior infarct , age undetermined Abnormal ECG No significant change since last tracing Confirmed by Addison Lank [phone]) on 08/12/2017 3:46:26 AM Also confirmed by Addison Lank [phone]), editor Hattie Perch ([id number])  on 08/12/2017 6:44:40 AM      Radiology Dg Chest 2 View  Result Date: 08/12/2017 CLINICAL DATA:  52 year old male with history of CHF presenting with shortness of breath. EXAM: CHEST - 2 VIEW COMPARISON:  Chest radiograph dated 04/08/2017 FINDINGS: Mild bilateral perihilar vascular and mid to lower lung field interstitial prominence most consistent with congestion and edema. Pneumonia is not excluded. Clinical correlation is recommended. There is no focal consolidation, pleural effusion, or pneumothorax. Stable top-normal cardiac size. No acute osseous pathology. IMPRESSION: Findings likely represent mild vascular congestion and interstitial edema. Pneumonia is not excluded. Clinical correlation is recommended. Electronically Signed   By: Anner Crete M.D.   On: 08/12/2017 02:03   Pertinent labs & imaging results that were  available during my care of the patient were reviewed by me and considered in my medical decision making (see chart for details).  Medications Ordered in ED Medications  furosemide (LASIX) injection 40 mg (40 mg Intravenous Given 08/12/17 0532)                                                                                                                                    Procedures Procedures  (including critical care time)  Medical Decision Making /  ED Course I have reviewed the nursing notes for this encounter and the patient's prior records (if available in EHR or on provided paperwork).    Work-up consistent with CHF exacerbation.  Chest x-ray with pulmonary vascular congestion and mild pulmonary edema.  Patient satting 100% on room air.  Renal function intact.  Electrolytes within normal limits.  No evidence of anemia.  EKG without acute ischemic changes.  Patient provided with IV Lasix, and he was able to diurese well.  Ambulated on pulse ox >95%.   Final Clinical Impression(s) / ED Diagnoses Final diagnoses:  Acute on chronic congestive heart failure, unspecified heart failure type ([place])   Disposition: Discharge  Condition: Good  I have discussed the results, Dx and Tx plan with the patient who expressed understanding and agree(s) with the plan. Discharge instructions discussed at great length. The patient was given strict return precautions who verbalized understanding of the instructions. No further questions at time of discharge.    ED Discharge Orders    None       Follow Up: Benito Mccreedy, [address] [place] [address] [phone]  Schedule an appointment as soon as possible for a visit  in 3-5 days      This chart was dictated using voice recognition software.  Despite best efforts to proofread,  errors can occur which can change the documentation meaning.   Fatima Blank, MD 08/12/17 [phone]

## 2017-08-12 NOTE — ED Notes (Signed)
Pt states improved sob after urine output of 2000

## 2017-08-12 NOTE — ED Notes (Signed)
Urinal provided to patient.  Aware of need to measure output.

## 2017-09-24 ENCOUNTER — Emergency Department (HOSPITAL_COMMUNITY)
Admission: EM | Admit: 2017-09-24 | Discharge: 2017-09-24 | Disposition: A | Discharge status: Home / Self Care | Payer: BLUE CROSS/BLUE SHIELD | Attending: Emergency Medicine | Admitting: Emergency Medicine

## 2017-09-24 ENCOUNTER — Other Ambulatory Visit: Payer: Self-pay

## 2017-09-24 ENCOUNTER — Emergency Department (HOSPITAL_COMMUNITY): Payer: BLUE CROSS/BLUE SHIELD

## 2017-09-24 ENCOUNTER — Encounter (HOSPITAL_COMMUNITY): Payer: Self-pay | Admitting: Emergency Medicine

## 2017-09-24 DIAGNOSIS — Z7982 Long term (current) use of aspirin: Secondary | ICD-10-CM | POA: Diagnosis not present

## 2017-09-24 DIAGNOSIS — Z79899 Other long term (current) drug therapy: Secondary | ICD-10-CM | POA: Diagnosis not present

## 2017-09-24 DIAGNOSIS — R0602 Shortness of breath: Secondary | ICD-10-CM | POA: Diagnosis present

## 2017-09-24 DIAGNOSIS — I10 Essential (primary) hypertension: Secondary | ICD-10-CM | POA: Insufficient documentation

## 2017-09-24 DIAGNOSIS — R42 Dizziness and giddiness: Secondary | ICD-10-CM | POA: Insufficient documentation

## 2017-09-24 DIAGNOSIS — Z87891 Personal history of nicotine dependence: Secondary | ICD-10-CM | POA: Insufficient documentation

## 2017-09-24 LAB — CBC WITH DIFFERENTIAL/PLATELET
Abs Immature Granulocytes: 0 10*3/uL (ref 0.0–0.1)
Basophils Absolute: 0 10*3/uL (ref 0.0–0.1)
Basophils Relative: 0 %
EOS PCT: 1 %
Eosinophils Absolute: 0.1 10*3/uL (ref 0.0–0.7)
HEMATOCRIT: 42.8 % (ref 39.0–52.0)
HEMOGLOBIN: 14.6 g/dL (ref 13.0–17.0)
Immature Granulocytes: 0 %
LYMPHS ABS: 1.7 10*3/uL (ref 0.7–4.0)
LYMPHS PCT: 18 %
MCH: 32.4 pg (ref 26.0–34.0)
MCHC: 34.1 g/dL (ref 30.0–36.0)
MCV: 94.9 fL (ref 78.0–100.0)
Monocytes Absolute: 0.7 10*3/uL (ref 0.1–1.0)
Monocytes Relative: 8 %
Neutro Abs: 7 10*3/uL (ref 1.7–7.7)
Neutrophils Relative %: 73 %
Platelets: 282 10*3/uL (ref 150–400)
RBC: 4.51 MIL/uL (ref 4.22–5.81)
RDW: 13.8 % (ref 11.5–15.5)
WBC: 9.5 10*3/uL (ref 4.0–10.5)

## 2017-09-24 LAB — RAPID URINE DRUG SCREEN, HOSP PERFORMED
AMPHETAMINES: NOT DETECTED
Barbiturates: NOT DETECTED
Benzodiazepines: NOT DETECTED
COCAINE: NOT DETECTED
OPIATES: NOT DETECTED
Tetrahydrocannabinol: NOT DETECTED

## 2017-09-24 LAB — URINALYSIS, ROUTINE W REFLEX MICROSCOPIC
BILIRUBIN URINE: NEGATIVE
GLUCOSE, UA: NEGATIVE mg/dL
Hgb urine dipstick: NEGATIVE
Ketones, ur: NEGATIVE mg/dL
Leukocytes, UA: NEGATIVE
NITRITE: NEGATIVE
Protein, ur: NEGATIVE mg/dL
SPECIFIC GRAVITY, URINE: 1.018 (ref 1.005–1.030)
pH: 7 (ref 5.0–8.0)

## 2017-09-24 LAB — BASIC METABOLIC PANEL
Anion gap: 13 (ref 5–15)
BUN: 10 mg/dL (ref 6–20)
CHLORIDE: 104 mmol/L (ref 98–111)
CO2: 23 mmol/L (ref 22–32)
CREATININE: 0.87 mg/dL (ref 0.61–1.24)
Calcium: 9.4 mg/dL (ref 8.9–10.3)
GFR calc Af Amer: >60 mL/min (ref >60)
GFR calc non Af Amer: >60 mL/min (ref >60)
GLUCOSE: 100 mg/dL — AB (ref 70–99)
POTASSIUM: 4 mmol/L (ref 3.5–5.1)
Sodium: 140 mmol/L (ref 135–145)

## 2017-09-24 LAB — MAGNESIUM: MAGNESIUM: 1.9 mg/dL (ref 1.7–2.4)

## 2017-09-24 LAB — APTT: APTT: 29 s (ref 24–36)

## 2017-09-24 LAB — BRAIN NATRIURETIC PEPTIDE: B Natriuretic Peptide: 30.8 pg/mL (ref 0.0–100.0)

## 2017-09-24 LAB — TROPONIN I: Troponin I: <0.03 ng/mL (ref <0.03)

## 2017-09-24 LAB — PROTIME-INR
INR: 0.95
Prothrombin Time: 12.6 seconds (ref 11.4–15.2)

## 2017-09-24 LAB — ETHANOL

## 2017-09-24 MED ORDER — LORAZEPAM 1 MG PO TABS
2.0000 mg | ORAL_TABLET | Freq: Once | ORAL | Status: AC
  Administered 2017-09-24: 2 mg via ORAL
  Filled 2017-09-24: qty 2

## 2017-09-24 MED ORDER — ALBUTEROL SULFATE (2.5 MG/3ML) 0.083% IN NEBU: 5.0000 mg | INHALATION_SOLUTION | Freq: Once | RESPIRATORY_TRACT | Status: DC

## 2017-09-24 NOTE — ED Notes (Signed)
Patient transported to X-ray 

## 2017-09-24 NOTE — Discharge Instructions (Addendum)
All the results in the ER are normal, labs and imaging. Make sure you work on controlling blood pressure with your doctor to avoid strokes, bleeding in your brain and heart attacks. We are not sure what is causing your symptoms. The workup in the ER is not complete, and is limited to screening for life threatening and emergent conditions only, so please see a primary care doctor for further evaluation.

## 2017-09-24 NOTE — ED Notes (Signed)
ED Provider at bedside. 

## 2017-09-24 NOTE — ED Provider Notes (Signed)
Patient's care signed out to follow-up MRI results.  MRI results showed no acute stroke.  Patient improved in the ER.  Discussed outpatient follow-up and results with patient.  Discussed importance of blood pressure control and seeing primary doctor.  Golda Acre, MD 09/24/17 [phone]

## 2017-09-24 NOTE — ED Notes (Signed)
Patient transported to MRI 

## 2017-09-24 NOTE — ED Provider Notes (Signed)
[facility] EMERGENCY DEPARTMENT Provider Note   CSN: [id number] Arrival date & time: 09/24/17  1100     History   Chief Complaint Chief Complaint  Patient presents with  . Shortness of Breath    HPI Shaun Stokes is a 52 y.o. male.  HPI  52 year old male comes in with chief complaint of shortness of breath, dizziness.  Patient has history of hypertension, hyperlipidemia, OSA, obesity and drinks liquor every day.  Patient reports that he went to his PCP because of shortness of breath for the past [redacted] weeks along with dizziness.  Patient's shortness of breath is not exertional and he denies any associated chest pain.  Patient also denies any orthopnea, paroxysmal nocturnal dyspnea or new cough.  Additionally, patient states that since yesterday he has been having constant dizziness.  Dizziness is described as spinning sensation.  There is no associated vision changes, headaches, neck pain, focal numbness or weakness with the dizziness.  Past Medical History:  Diagnosis Date  . Chest pain   . Goiter   . Hyperlipemia   . Hypertension   . Insomnia   . Obesity   . OSA (obstructive sleep apnea) 08/24/2014  . Prediabetes   . Vitamin D deficiency     Patient Active Problem List   Diagnosis Date Noted  . Essential hypertension 04/27/2015  . Dyspnea 04/26/2015  . OSA (obstructive sleep apnea) 08/24/2014  . Streptococcal sore throat 06/04/2014  . Pharyngitis 06/02/2014  . Leukocytosis 06/02/2014  . Severe obesity (BMI >= 40) ([place]) 06/02/2014  . Pharyngitis, acute 06/02/2014  . Prediabetes   . Vitamin D deficiency     Past Surgical History:  Procedure Laterality Date  . COLONOSCOPY WITH PROPOFOL N/A 09/16/2015   Procedure: COLONOSCOPY WITH PROPOFOL;  Surgeon: Carol Ada, MD;  Location: [facility];  Service: Endoscopy;  Laterality: N/A;  . NO PAST SURGERIES          Home Medications    Prior to Admission medications   Medication Sig Start Date  End Date Taking? Authorizing Provider  acetaminophen (TYLENOL) 500 MG tablet Take 500 mg by mouth every 4 (four) hours as needed for mild pain or moderate pain.    Yes [provider]  ASPIRIN LOW DOSE 81 MG EC tablet Take 81 mg by mouth daily. 08/21/17  Yes [provider]  carvedilol (COREG) 3.125 MG tablet Take 3.125 mg by mouth 2 (two) times daily. 08/23/15  Yes [provider]  folic acid (FOLVITE) 1 MG tablet Take 1 mg by mouth daily. 08/21/17  Yes [provider]  furosemide (LASIX) 40 MG tablet Take 40 mg by mouth daily.  08/23/15  Yes [provider]  losartan (COZAAR) 50 MG tablet Take 1 tablet (50 mg total) by mouth daily. Patient taking differently: Take 25 mg by mouth daily.  04/09/17  Yes Pollina, Gwenyth Allegra, MD  naproxen sodium (ALEVE) 220 MG tablet Take 220 mg by mouth 2 (two) times daily as needed (for pain).   Yes [provider]  RA VITAMIN D-3 1000 units tablet Take 1,000 Units by mouth daily. 08/23/15  Yes [provider]  sertraline (ZOLOFT) 50 MG tablet Take 50 mg by mouth daily.  07/18/17  Yes [provider]  thiamine (VITAMIN B-1) 100 MG tablet Take 100 mg by mouth daily. 08/21/17  Yes [provider]  traZODone (DESYREL) 100 MG tablet Take 200 mg by mouth at bedtime. 07/18/17  Yes [provider]  naproxen (NAPROSYN)  375 MG tablet Take 1 tablet (375 mg total) by mouth 2 (two) times daily. Patient not taking: Reported on 08/12/2017 09/06/15   Montine Circle, PA-C  nebivolol (BYSTOLIC) 5 MG tablet Take 1 tablet (5 mg total) by mouth daily. Patient not taking: Reported on 08/12/2017 04/26/15   Tanda Rockers, MD    Family History Family History  Problem Relation Age of Onset  . Heart attack Father     Social History Social History   Tobacco Use  . Smoking status: Former Smoker    Packs/day: 1.00    Years: 32.00    Pack years: 32.00    Types: Cigarettes    Last attempt to quit:  11/18/2014    Years since quitting: 2.8  . Smokeless tobacco: Never Used  Substance Use Topics  . Alcohol use: Yes    Alcohol/week: 3.0 standard drinks    Types: 3 Shots of liquor per week    Comment: drinks liquor daily  . Drug use: No     Allergies   Patient has no known allergies.   Review of Systems Review of Systems  Eyes: Negative for visual disturbance.  Respiratory: Positive for shortness of breath.   Cardiovascular: Negative for chest pain.  Gastrointestinal: Negative for nausea and vomiting.  Neurological: Positive for light-headedness.  All other systems reviewed and are negative.    Physical Exam Updated Vital Signs BP (!) 139/92 (BP Location: Right Wrist)   Pulse 98   Temp 98.4 F (36.9 C)   Resp 19   Ht 6\' 2"  (1.88 m)   Wt (!) 163.3 kg   SpO2 99%   BMI 46.22 kg/m   Physical Exam  Constitutional: He is oriented to person, place, and time. He appears well-developed.  HENT:  Head: Atraumatic.  Neck: Neck supple. No JVD present.  Cardiovascular: Normal rate.  Pulmonary/Chest: Effort normal.  Musculoskeletal:       Right lower leg: He exhibits no edema.       Left lower leg: He exhibits no edema.  Neurological: He is alert and oriented to person, place, and time.  Cerebellar exam is normal (finger to nose) Sensory exam normal for bilateral upper and lower extremities - and patient is able to discriminate between sharp and dull. Motor exam is 4+/5   Skin: Skin is warm.  Nursing note and vitals reviewed.    ED Treatments / Results  Labs (all labs ordered are listed, but only abnormal results are displayed) Labs Reviewed  BASIC METABOLIC PANEL - Abnormal; Notable for the following components:      Result Value   Glucose, Bld 100 (*)    All other components within normal limits  CBC WITH DIFFERENTIAL/PLATELET  BRAIN NATRIURETIC PEPTIDE  ETHANOL  PROTIME-INR  APTT  RAPID URINE DRUG SCREEN, HOSP PERFORMED  URINALYSIS, ROUTINE W REFLEX  MICROSCOPIC  TROPONIN I  MAGNESIUM    EKG EKG Interpretation  Date/Time:  Tuesday September 24 2017 11:05:04 EDT Ventricular Rate:  92 PR Interval:    QRS Duration: 97 QT Interval:  366 QTC Calculation: 453 R Axis:   -7 Text Interpretation:  Sinus rhythm Probable anterior infarct, old No acute changes No significant change since last tracing Confirmed by Varney Biles [phone]) on 09/24/2017 11:14:02 AM Also confirmed by Varney Biles [phone]), editor Philomena Doheny [phone])  on 09/24/2017 11:38:55 AM   Radiology No results found.  Procedures Procedures (including critical care time)  Medications Ordered in ED Medications  LORazepam (ATIVAN) tablet 2 mg (2  mg Oral Given 09/24/17 1435)     Initial Impression / Assessment and Plan / ED Course  I have reviewed the triage vital signs and the nursing notes.  Pertinent labs & imaging results that were available during my care of the patient were reviewed by me and considered in my medical decision making (see chart for details).     52 year old male comes in with chief complaint of shortness of breath and dizziness.  Patient has history of poorly controlled blood pressure, morbid obesity, OSA.  Patient has had shortness of breath over the past 2 weeks that is not exertional or positional.  Clinical concerns for congestive heart failure is extremely low.  EKG ordered and does not reveal any acute ischemic findings.  Chest x-ray pending.  No risk factors for PE, and suspicion for PE is extremely low therefore we will not get d-dimer or CT scan for further evaluation.  What is more concerning is persistent dizziness that started yesterday evening.  Patient describes her dizziness as vertigo which is constant and patient is able to ambulate.  There are some features of central vertigo based on history alone, and the exam is not indicative of BPPV.  NIH stroke scale is 0.  CT head is negative.  MRI is been ordered, if there is no acute  stroke and will discharge.  Additionally, patient has history of heavy alcohol use which I do not think is the underlying process for the dizziness.  Patient's last drink was at 8 PM in the evening yesterday.  Final Clinical Impressions(s) / ED Diagnoses   Final diagnoses:  Dizziness    ED Discharge Orders    None       Varney Biles, MD 09/27/17 1047

## 2017-09-24 NOTE — ED Triage Notes (Addendum)
Per [facility] pt coming from PCP office c/o shortness of breath x 2 weeks and dizziness onset of yesterday at work. Pt ambulated to restroom during triage and denied any dizziness.   Patient adds "my anxiety is beginning to kick in. It could be related to withdrawals." patient states he drinks alcohol regularly. Last drank vodka last night.

## 2017-10-07 ENCOUNTER — Emergency Department (HOSPITAL_COMMUNITY): Payer: BLUE CROSS/BLUE SHIELD

## 2017-10-07 ENCOUNTER — Emergency Department (HOSPITAL_COMMUNITY)
Admission: EM | Admit: 2017-10-07 | Discharge: 2017-10-07 | Disposition: A | Discharge status: Home / Self Care | Payer: BLUE CROSS/BLUE SHIELD | Attending: Emergency Medicine | Admitting: Emergency Medicine

## 2017-10-07 ENCOUNTER — Other Ambulatory Visit: Payer: Self-pay

## 2017-10-07 ENCOUNTER — Encounter (HOSPITAL_COMMUNITY): Payer: Self-pay | Admitting: Emergency Medicine

## 2017-10-07 DIAGNOSIS — R42 Dizziness and giddiness: Secondary | ICD-10-CM | POA: Diagnosis present

## 2017-10-07 DIAGNOSIS — Z87891 Personal history of nicotine dependence: Secondary | ICD-10-CM | POA: Insufficient documentation

## 2017-10-07 DIAGNOSIS — R0602 Shortness of breath: Secondary | ICD-10-CM | POA: Insufficient documentation

## 2017-10-07 DIAGNOSIS — Z79899 Other long term (current) drug therapy: Secondary | ICD-10-CM | POA: Diagnosis not present

## 2017-10-07 DIAGNOSIS — I10 Essential (primary) hypertension: Secondary | ICD-10-CM | POA: Diagnosis not present

## 2017-10-07 DIAGNOSIS — F1010 Alcohol abuse, uncomplicated: Secondary | ICD-10-CM

## 2017-10-07 LAB — COMPREHENSIVE METABOLIC PANEL
ALT: 27 U/L (ref 0–44)
ANION GAP: 14 (ref 5–15)
AST: 31 U/L (ref 15–41)
Albumin: 3.8 g/dL (ref 3.5–5.0)
Alkaline Phosphatase: 79 U/L (ref 38–126)
BUN: 7 mg/dL (ref 6–20)
CHLORIDE: 104 mmol/L (ref 98–111)
CO2: 22 mmol/L (ref 22–32)
Calcium: 9.1 mg/dL (ref 8.9–10.3)
Creatinine, Ser: 0.94 mg/dL (ref 0.61–1.24)
GFR calc non Af Amer: >60 mL/min (ref >60)
Glucose, Bld: 82 mg/dL (ref 70–99)
POTASSIUM: 3.9 mmol/L (ref 3.5–5.1)
SODIUM: 140 mmol/L (ref 135–145)
Total Bilirubin: 1 mg/dL (ref 0.3–1.2)
Total Protein: 7.5 g/dL (ref 6.5–8.1)

## 2017-10-07 LAB — CBC WITH DIFFERENTIAL/PLATELET
Abs Immature Granulocytes: 0 10*3/uL (ref 0.0–0.1)
BASOS ABS: 0.1 10*3/uL (ref 0.0–0.1)
Basophils Relative: 1 %
Eosinophils Absolute: 0.1 10*3/uL (ref 0.0–0.7)
Eosinophils Relative: 1 %
HEMATOCRIT: 40.5 % (ref 39.0–52.0)
HEMOGLOBIN: 13.5 g/dL (ref 13.0–17.0)
IMMATURE GRANULOCYTES: 0 %
LYMPHS ABS: 2 10*3/uL (ref 0.7–4.0)
LYMPHS PCT: 24 %
MCH: 31.9 pg (ref 26.0–34.0)
MCHC: 33.3 g/dL (ref 30.0–36.0)
MCV: 95.7 fL (ref 78.0–100.0)
Monocytes Absolute: 0.8 10*3/uL (ref 0.1–1.0)
Monocytes Relative: 9 %
NEUTROS ABS: 5.6 10*3/uL (ref 1.7–7.7)
NEUTROS PCT: 65 %
Platelets: 252 10*3/uL (ref 150–400)
RBC: 4.23 MIL/uL (ref 4.22–5.81)
RDW: 14.1 % (ref 11.5–15.5)
WBC: 8.6 10*3/uL (ref 4.0–10.5)

## 2017-10-07 LAB — BRAIN NATRIURETIC PEPTIDE: B Natriuretic Peptide: 142.8 pg/mL — ABNORMAL HIGH (ref 0.0–100.0)

## 2017-10-07 LAB — I-STAT TROPONIN, ED: TROPONIN I, POC: 0 ng/mL (ref 0.00–0.08)

## 2017-10-07 LAB — D-DIMER, QUANTITATIVE (NOT AT ARMC): D DIMER QUANT: 0.29 ug{FEU}/mL (ref 0.00–0.50)

## 2017-10-07 MED ORDER — CHLORDIAZEPOXIDE HCL 25 MG PO CAPS
50.0000 mg | ORAL_CAPSULE | Freq: Once | ORAL | Status: AC
Start: 2017-10-07 — End: 2017-10-07
  Administered 2017-10-07: 50 mg via ORAL
  Filled 2017-10-07: qty 2

## 2017-10-07 MED ORDER — LORAZEPAM 1 MG PO TABS
2.0000 mg | ORAL_TABLET | Freq: Once | ORAL | Status: AC
  Administered 2017-10-07: 2 mg via ORAL
  Filled 2017-10-07: qty 2

## 2017-10-07 MED ORDER — CHLORDIAZEPOXIDE HCL 25 MG PO CAPS: ORAL_CAPSULE | ORAL | 0 refills | Status: DC

## 2017-10-07 NOTE — ED Triage Notes (Signed)
To ED via [facility] from work with c/o workers c/o that pt was "stumbling around" - placed in w/c - continued to c/o dizziness and had a near syncopal event.  On arrival- pt is alert/oriented x 4 , moves all extremities equally.

## 2017-10-07 NOTE — Discharge Instructions (Addendum)
Is very important to get medical help to detox yourself from alcohol.  Do not try to do this by yourself.  The medicine I prescribed will help prevent withdrawals but you need to go to an official detoxification center.  If you develop any worsening symptoms such as dizziness, vomiting, headache, chest pain, shortness of breath, or any other new/concerning symptoms and return to the ER for evaluation.  It is also important to follow-up with your primary care doctor about your blood pressure.   Substance Abuse Treatment Programs  Intensive Outpatient Programs [facility]     [address], [place]       [facility] [place] [address] [place], [place]  [facility]     (Inpatient and outpatient)     [address]           [place] [phone] (Suboxone and Methadone)  [place], [place] [address]      [place] [address] [place], [place]  [facility] (Outpatient/Inpatient, [place])    (insurance only) [phone]             [facility] ([place]) [place], [place]     [facility]     [address]     [place], [place]       [facility] (for caregivers and family) [phone] [address] Kristeen Mans. [place], [place]      Residential Treatment Programs [facility]      [address] [place], [place] [address]  [phone]       [facility] [place], [place] [address] [phone]  [facility]        [phone]       [facility] [phone]  [facility] ([place].)             [place], [employer] or [place] of [place] [address] [place], [address] [phone]  [facility] [place]    [address]      [place], [place]       [facility] [address] [place], [place]  [place]   [address] [place], [place] [address]     [phone]      Admissions: 8am-3pm M-F  [facility] (RTS) [place],  [place] [phone]  [facility] Program: Residential Program ([address])   [place], [place] or [phone]     [facility]: [facility] [place], [place] (Walk in Hours over the weekend or by referral)  [facility] [place], [place], [place] [address] [phone]  Crisis Mobile: [facility]:  [phone] (for crisis response 24 hours a day) [facility] Hotline:      [phone] Outpatient Psychiatry and Counseling  [facility]: Mobile Crisis Management 24 hours:  [phone]  [facility] of the [employer] sliding scale fee and walk in schedule: M-F 8am-12pm/1pm-3pm [place], [place] [address] [place], [place] [address] [phone]  [facility] (Formerly known as [facility] [employer])- new patient walk-in appointments available Monday - Friday 8am -3pm.          [address] [place], [place] [address] [phone] or crisis line- [facility]/ Intensive Outpatient Therapy Program [place], [place] [address] [place]      [phone] [address] [place], [place] [address]                 [place]   [facility] [phone]. [place], [place]  [address]   [employer] of Care          [address] Johnette Abraham  [place], [place] [address]       [phone]  [facility] [address] [place], [place] [address] [phone]  [facility]    [address] [place], [place] [address]     [place], [place] Joycelyn Man     [place] [address]     [phone]       [facility] [place] [address]  [facility]     [address], [place], [place], [place] [address] [place]     [address]     [place], [place] [address]     [phone]       [facility] [address] [place], [place] [address] [phone] Resources for Temporary Residential Assistance/Crisis [place] [facility]) M-F 8am-3pm   [address] [place], [place] [address]   [phone] Services include: laundry, barbering, support groups, case management, phone  & computer access, showers, AA/NA mtgs, mental health/substance abuse nurse, job skills class, disability information, VA assistance, spiritual classes, etc.   [facility]   [address], [place]     [place]              [employer] (women and children)       [place]. [place], [place] [address] [phone] [email or web address] for application and process Application Required  [facility]   [address]    [place] [address]     [phone]                    [place], [place] [address] [phone]([email or web address] application appt.) Application Required  [facility]  (women only)    [address]     [place], [place] [address]     [phone]      Intake starts 6pm daily Need valid ID, SSC, & Police report [employer] [address] [place], [place] [phone] Application Required  [employer] (men only)     [place].      [place], [place]       [place] (Pregnant women only) [address] [place], [place]  [facility]      [place] Dani [address].      [place], [place] [address]     [phone]             [facility] [address] [place], [place] 90 day commitment/SA/Application process  [facility](men only)     [address]     [place], [place]       Check-in at [facility] [address] [place], [place] [address] [phone] Men/Women/Women and Children must be there by 7 pm  [place], [place]

## 2017-10-07 NOTE — ED Provider Notes (Signed)
[facility] EMERGENCY DEPARTMENT Provider Note   CSN: [id number] Arrival date & time: 10/07/17  1148     History   Chief Complaint Chief Complaint  Patient presents with  . Dizziness    HPI Shaun Stokes is a 52 y.o. male.  HPI  52 year old male presents with dizziness.  Started since he woke up this morning.  Feels like he is lightheaded and might pass out.  It is not changed by positions or movement of the head.  It does not feel like the room is spinning.  He has not actually passed out.  There is no headache, blurry vision, chest pain, vomiting, abdominal pain or weakness/numbness.  He feels a little short of breath but states he also feels anxious.  Whenever he feels anxious he gets this similar type of shortness of breath.  He feels anxious nearly every day.  He is been taking Zoloft for the anxiety.  He was here a couple weeks ago and had a negative work-up including negative MRI.  He states that the only thing that seemed to help his dizziness at that time was the medicine given for anxiety.  He states that the dizziness is similar.  Past Medical History:  Diagnosis Date  . Chest pain   . Goiter   . Hyperlipemia   . Hypertension   . Insomnia   . Obesity   . OSA (obstructive sleep apnea) 08/24/2014  . Prediabetes   . Vitamin D deficiency     Patient Active Problem List   Diagnosis Date Noted  . Essential hypertension 04/27/2015  . Dyspnea 04/26/2015  . OSA (obstructive sleep apnea) 08/24/2014  . Streptococcal sore throat 06/04/2014  . Pharyngitis 06/02/2014  . Leukocytosis 06/02/2014  . Severe obesity (BMI >= 40) ([place]) 06/02/2014  . Pharyngitis, acute 06/02/2014  . Prediabetes   . Vitamin D deficiency     Past Surgical History:  Procedure Laterality Date  . COLONOSCOPY WITH PROPOFOL N/A 09/16/2015   Procedure: COLONOSCOPY WITH PROPOFOL;  Surgeon: Carol Ada, MD;  [facility];  Service: Endoscopy;  Laterality: N/A;  . NO PAST  SURGERIES          Home Medications    Prior to Admission medications   Medication Sig Start Date End Date Taking? Authorizing Provider  acetaminophen (TYLENOL) 500 MG tablet Take 500 mg by mouth every 4 (four) hours as needed for mild pain or moderate pain.     [provider]  ASPIRIN LOW DOSE 81 MG EC tablet Take 81 mg by mouth daily. 08/21/17   [provider]  carvedilol (COREG) 3.125 MG tablet Take 3.125 mg by mouth 2 (two) times daily. 08/23/15   [provider]  chlordiazePOXIDE (LIBRIUM) 25 MG capsule 50mg  PO TID x 1D, then 25-50mg  PO BID X 1D, then 25-50mg  PO QD X 1D 10/07/17   Sherwood Gambler, MD  folic acid (FOLVITE) 1 MG tablet Take 1 mg by mouth daily. 08/21/17   [provider]  furosemide (LASIX) 40 MG tablet Take 40 mg by mouth daily.  08/23/15   [provider]  losartan (COZAAR) 50 MG tablet Take 1 tablet (50 mg total) by mouth daily. Patient taking differently: Take 25 mg by mouth daily.  04/09/17   Orpah Greek, MD  naproxen (NAPROSYN) 375 MG tablet Take 1 tablet (375 mg total) by mouth 2 (two) times daily. Patient not taking: Reported on 08/12/2017 09/06/15   Montine Circle, PA-C  naproxen sodium (ALEVE)  220 MG tablet Take 220 mg by mouth 2 (two) times daily as needed (for pain).    [provider]  nebivolol (BYSTOLIC) 5 MG tablet Take 1 tablet (5 mg total) by mouth daily. Patient not taking: Reported on 08/12/2017 04/26/15   Tanda Rockers, MD  RA VITAMIN D-3 1000 units tablet Take 1,000 Units by mouth daily. 08/23/15   [provider]  sertraline (ZOLOFT) 50 MG tablet Take 50 mg by mouth daily.  07/18/17   [provider]  thiamine (VITAMIN B-1) 100 MG tablet Take 100 mg by mouth daily. 08/21/17   [provider]  traZODone (DESYREL) 100 MG tablet Take 200 mg by mouth at bedtime. 07/18/17   [provider]    Family History Family History  Problem Relation Age of Onset  .  Heart attack Father     Social History Social History   Tobacco Use  . Smoking status: Former Smoker    Packs/day: 1.00    Years: 32.00    Pack years: 32.00    Types: Cigarettes    Last attempt to quit: 11/18/2014    Years since quitting: 2.8  . Smokeless tobacco: Never Used  Substance Use Topics  . Alcohol use: Yes    Comment: a fifth a day sometimes a pint  . Drug use: No     Allergies   Patient has no known allergies.   Review of Systems Review of Systems  Eyes: Negative for visual disturbance.  Respiratory: Positive for shortness of breath.   Cardiovascular: Negative for chest pain.  Gastrointestinal: Negative for abdominal pain, nausea and vomiting.  Musculoskeletal: Negative for neck pain.  Neurological: Positive for light-headedness. Negative for syncope, weakness, numbness and headaches.  All other systems reviewed and are negative.    Physical Exam Updated Vital Signs BP (!) 185/103   Pulse 93   Temp 98.4 F (36.9 C) (Oral)   Resp 15   Ht 6\' 2"  (1.88 m)   Wt (!) 163.3 kg   SpO2 96%   BMI 46.22 kg/m   Physical Exam  Constitutional: He is oriented to person, place, and time. He appears well-developed and well-nourished. No distress.  Morbidly obese  HENT:  Head: Normocephalic and atraumatic.  Right Ear: External ear normal.  Left Ear: External ear normal.  Nose: Nose normal.  Eyes: Pupils are equal, round, and reactive to light. EOM are normal. Right eye exhibits no discharge. Left eye exhibits no discharge.  Neck: Normal range of motion. Neck supple.  Cardiovascular: Normal rate, regular rhythm and normal heart sounds.  Pulmonary/Chest: Effort normal and breath sounds normal.  Abdominal: Soft. He exhibits no distension. There is no tenderness.  Musculoskeletal: He exhibits no edema.  Neurological: He is alert and oriented to person, place, and time.  CN 3-12 grossly intact. 5/5 strength in all 4 extremities. Grossly normal sensation. Normal  finger to nose. Mild tremor on finger to nose  Skin: Skin is warm and dry. He is not diaphoretic.  Nursing note and vitals reviewed.    ED Treatments / Results  Labs (all labs ordered are listed, but only abnormal results are displayed) Labs Reviewed  BRAIN NATRIURETIC PEPTIDE - Abnormal; Notable for the following components:      Result Value   B Natriuretic Peptide 142.8 (*)    All other components within normal limits  COMPREHENSIVE METABOLIC PANEL  CBC WITH DIFFERENTIAL/PLATELET  D-DIMER, QUANTITATIVE (NOT AT [facility])  I-STAT TROPONIN, ED    EKG EKG  Interpretation  Date/Time:  Monday October 07 2017 11:49:01 EDT Ventricular Rate:  96 PR Interval:    QRS Duration: 87 QT Interval:  351 QTC Calculation: 444 R Axis:   -15 Text Interpretation:  Sinus rhythm Short PR interval Borderline left axis deviation Anterior infarct, old no acute ST/T changes Artifact no significant change since Sep 24 2017 Confirmed by Sherwood Gambler [phone]) on 10/07/2017 12:47:17 PM   Radiology Dg Chest 2 View  Result Date: 10/07/2017 CLINICAL DATA:  Near syncopal episode, dizziness, stumbling. EXAM: CHEST - 2 VIEW COMPARISON:  PA and lateral chest x-ray of September 24, 2017 FINDINGS: The lungs are well-expanded. There is no focal infiltrate. The interstitial markings are coarse though stable. There is no pleural effusion. The heart is top-normal in size. The pulmonary vascularity is mildly engorged which reflects a change. There is calcification in the wall of the aortic arch. There is mild multilevel degenerative disc disease of the thoracic spine. IMPRESSION: Chronic bronchitic changes. Probable low-grade CHF with no more than mild interstitial edema. No acute pneumonia. Thoracic aortic atherosclerosis. Electronically Signed   By: David  Martinique M.D.   On: 10/07/2017 13:28    Procedures Procedures (including critical care time)  Medications Ordered in ED Medications  LORazepam (ATIVAN) tablet 2 mg (2 mg  Oral Given 10/07/17 1304)  chlordiazePOXIDE (LIBRIUM) capsule 50 mg (50 mg Oral Given 10/07/17 1443)     Initial Impression / Assessment and Plan / ED Course  I have reviewed the triage vital signs and the nursing notes.  Pertinent labs & imaging results that were available during my care of the patient were reviewed by me and considered in my medical decision making (see chart for details).     Dizziness has improved with Ativan.  Unclear exact cause of his dizziness but chronic alcohol abuse could be contributing as he drinks about a pint of vodka a day.  Last drink last night.  He normally does not drink it tonight so I do not think withdrawal is the primary cause.  Otherwise he had a similar presentation a couple weeks ago and had a negative MRI and other work-up.  Shortness of breath resolved after the Ativan which leads me think it probably is more anxiety like he thinks rather than acute CHF, PE, or ACS.  He is noted to be hypertensive which could be from the alcohol and his other comorbidities.  He will need to follow-up with PCP.  However I do not think this represents a hypertensive emergency.  I discussed outpatient options for detox and the importance of not trying to do this on his own.  He will be prescribed Librium.  No acute psychosis or suicidal/homicidal thoughts.  He is able to ambulate without difficulty.  Highly doubt stroke.  Return precautions.  Final Clinical Impressions(s) / ED Diagnoses   Final diagnoses:  Dizziness  Alcohol abuse    ED Discharge Orders         Ordered    chlordiazePOXIDE (LIBRIUM) 25 MG capsule     10/07/17 1427           Sherwood Gambler, MD 10/07/17 1623

## 2017-10-27 ENCOUNTER — Encounter (HOSPITAL_COMMUNITY): Payer: Self-pay

## 2017-10-27 ENCOUNTER — Emergency Department (HOSPITAL_COMMUNITY): Payer: BLUE CROSS/BLUE SHIELD

## 2017-10-27 ENCOUNTER — Emergency Department (HOSPITAL_COMMUNITY)
Admission: EM | Admit: 2017-10-27 | Discharge: 2017-10-27 | Disposition: A | Discharge status: Home / Self Care | Payer: BLUE CROSS/BLUE SHIELD | Attending: Emergency Medicine | Admitting: Emergency Medicine

## 2017-10-27 DIAGNOSIS — Z79899 Other long term (current) drug therapy: Secondary | ICD-10-CM | POA: Insufficient documentation

## 2017-10-27 DIAGNOSIS — I10 Essential (primary) hypertension: Secondary | ICD-10-CM | POA: Insufficient documentation

## 2017-10-27 DIAGNOSIS — R42 Dizziness and giddiness: Secondary | ICD-10-CM | POA: Diagnosis present

## 2017-10-27 DIAGNOSIS — R0600 Dyspnea, unspecified: Secondary | ICD-10-CM | POA: Insufficient documentation

## 2017-10-27 DIAGNOSIS — F410 Panic disorder [episodic paroxysmal anxiety] without agoraphobia: Secondary | ICD-10-CM | POA: Insufficient documentation

## 2017-10-27 DIAGNOSIS — Z7982 Long term (current) use of aspirin: Secondary | ICD-10-CM | POA: Insufficient documentation

## 2017-10-27 DIAGNOSIS — F419 Anxiety disorder, unspecified: Secondary | ICD-10-CM

## 2017-10-27 LAB — BASIC METABOLIC PANEL
Anion gap: 14 (ref 5–15)
BUN: 10 mg/dL (ref 6–20)
CO2: 18 mmol/L — ABNORMAL LOW (ref 22–32)
Calcium: 9.1 mg/dL (ref 8.9–10.3)
Chloride: 104 mmol/L (ref 98–111)
Creatinine, Ser: 0.96 mg/dL (ref 0.61–1.24)
GFR calc non Af Amer: >60 mL/min (ref >60)
Glucose, Bld: 110 mg/dL — ABNORMAL HIGH (ref 70–99)
POTASSIUM: 3.5 mmol/L (ref 3.5–5.1)
SODIUM: 136 mmol/L (ref 135–145)

## 2017-10-27 LAB — D-DIMER, QUANTITATIVE: D-Dimer, Quant: <0.27 ug/mL-FEU (ref 0.00–0.50)

## 2017-10-27 LAB — CBC
HEMATOCRIT: 39.2 % (ref 39.0–52.0)
Hemoglobin: 13.1 g/dL (ref 13.0–17.0)
MCH: 32.1 pg (ref 26.0–34.0)
MCHC: 33.4 g/dL (ref 30.0–36.0)
MCV: 96.1 fL (ref 78.0–100.0)
Platelets: 213 10*3/uL (ref 150–400)
RBC: 4.08 MIL/uL — AB (ref 4.22–5.81)
RDW: 13.9 % (ref 11.5–15.5)
WBC: 9.9 10*3/uL (ref 4.0–10.5)

## 2017-10-27 LAB — I-STAT TROPONIN, ED: TROPONIN I, POC: 0 ng/mL (ref 0.00–0.08)

## 2017-10-27 LAB — BRAIN NATRIURETIC PEPTIDE: B Natriuretic Peptide: 36.2 pg/mL (ref 0.0–100.0)

## 2017-10-27 MED ORDER — LORAZEPAM 1 MG PO TABS
1.0000 mg | ORAL_TABLET | Freq: Once | ORAL | Status: AC
  Administered 2017-10-27: 1 mg via ORAL
  Filled 2017-10-27: qty 1

## 2017-10-27 NOTE — ED Notes (Signed)
Pt currently hyperventilating at 30 breaths/min; pt states hx of anxiety; pts VSS but placed on 2 L [place] O2 for comfort; instructed patient to take slow deep breaths in nose and out mouth

## 2017-10-27 NOTE — ED Triage Notes (Signed)
Patient arrived by [facility] for dizziness and SOB. States that he had similar event back in August and no diagnosis. Denies CP, alert and oriented. Oxygen sats 99% on RA. Also complains of intermittent lower extremity edema. Lungs slightly diminished. NAD.No neuro deficits

## 2017-10-27 NOTE — Discharge Instructions (Addendum)
It was our pleasure to provide your ER care today - we hope that you feel better.  Follow up with primary care doctor in the next 1-2 days for recheck - call your doctors office tomorrow AM to arrange appointment time.   Return to ER if worse, new symptoms, high fevers, increased trouble breathing, new or severe pain, other concern.   You were given medication in the ER - no driving for the next 6 hours.

## 2017-10-27 NOTE — ED Provider Notes (Addendum)
[place] EMERGENCY DEPARTMENT Provider Note   CSN: [id number] Arrival date & time: 10/27/17  1759     History   Chief Complaint No chief complaint on file.   HPI Shaun Stokes is a 52 y.o. male.  Patient c/o feeling as if having panic attack. Pt is hyperventilating, numbness/tingling bil hands - notes similar symptoms in past. States felt lightheaded earlier, but no faintness or dizziness currently. No headaches. No chest pain or discomfort. No sob or unusual doe. No abd pain. No nausea, vomiting or diarrhea. No fever or chills. No gu c/o. Denies trauma, fall or injury. Pt denies any unusual stressor, no feelings of depression.   The history is provided by the patient.    Past Medical History:  Diagnosis Date  . Chest pain   . Goiter   . Hyperlipemia   . Hypertension   . Insomnia   . Obesity   . OSA (obstructive sleep apnea) 08/24/2014  . Prediabetes   . Vitamin D deficiency     Patient Active Problem List   Diagnosis Date Noted  . Essential hypertension 04/27/2015  . Dyspnea 04/26/2015  . OSA (obstructive sleep apnea) 08/24/2014  . Streptococcal sore throat 06/04/2014  . Pharyngitis 06/02/2014  . Leukocytosis 06/02/2014  . Severe obesity (BMI >= 40) ([place]) 06/02/2014  . Pharyngitis, acute 06/02/2014  . Prediabetes   . Vitamin D deficiency     Past Surgical History:  Procedure Laterality Date  . COLONOSCOPY WITH PROPOFOL N/A 09/16/2015   Procedure: COLONOSCOPY WITH PROPOFOL;  Surgeon: Carol Ada, MD;  Location: [facility] END[facility]COPY;  Service: Endoscopy;  Laterality: N/A;  . NO PAST SURGERIES          Home Medications    Prior to Admission medications   Medication Sig Start Date End Date Taking? Authorizing Provider  acetaminophen (TYLENOL) 500 MG tablet Take 500 mg by mouth every 4 (four) hours as needed for mild pain or moderate pain.     [provider]  ASPIRIN LOW DOSE 81 MG EC tablet Take 81 mg by mouth daily. 08/21/17   [provider]  carvedilol (COREG) 3.125 MG tablet Take 3.125 mg by mouth 2 (two) times daily. 08/23/15   [provider]  chlordiazePOXIDE (LIBRIUM) 25 MG capsule 50mg  PO TID x 1D, then 25-50mg  PO BID X 1D, then 25-50mg  PO QD X 1D 10/07/17   Sherwood Gambler, MD  folic acid (FOLVITE) 1 MG tablet Take 1 mg by mouth daily. 08/21/17   [provider]  furosemide (LASIX) 40 MG tablet Take 40 mg by mouth daily.  08/23/15   [provider]  losartan (COZAAR) 50 MG tablet Take 1 tablet (50 mg total) by mouth daily. Patient taking differently: Take 25 mg by mouth daily.  04/09/17   Orpah Greek, MD  naproxen (NAPROSYN) 375 MG tablet Take 1 tablet (375 mg total) by mouth 2 (two) times daily. Patient not taking: Reported on 08/12/2017 09/06/15   Montine Circle, PA-C  naproxen sodium (ALEVE) 220 MG tablet Take 220 mg by mouth 2 (two) times daily as needed (for pain).    [provider]  nebivolol (BYSTOLIC) 5 MG tablet Take 1 tablet (5 mg total) by mouth daily. Patient not taking: Reported on 08/12/2017 04/26/15   Tanda Rockers, MD  RA VITAMIN D-3 1000 units tablet Take 1,000 Units by mouth daily. 08/23/15   [provider]  sertraline (ZOLOFT) 50 MG tablet Take 50 mg by mouth daily.  07/18/17   [provider]  thiamine (VITAMIN B-1) 100 MG tablet Take 100 mg by mouth daily. 08/21/17   [provider]  traZODone (DESYREL) 100 MG tablet Take 200 mg by mouth at bedtime. 07/18/17   [provider]    Family History Family History  Problem Relation Age of Onset  . Heart attack Father     Social History Social History   Tobacco Use  . Smoking status: Former Smoker    Packs/day: 1.00    Years: 32.00    Pack years: 32.00    Types: Cigarettes    Last attempt to quit: 11/18/2014    Years since quitting: 2.9  . Smokeless tobacco: Never Used  Substance Use Topics  . Alcohol use: Yes    Comment: a fifth a day sometimes a pint  .  Drug use: No     Allergies   Patient has no known allergies.   Review of Systems Review of Systems  Constitutional: Negative for fever.  HENT: Negative for sore throat.   Eyes: Negative for visual disturbance.  Respiratory: Negative for cough and shortness of breath.   Cardiovascular: Negative for chest pain and leg swelling.  Gastrointestinal: Negative for abdominal pain, diarrhea and vomiting.  Genitourinary: Negative for dysuria and flank pain.  Musculoskeletal: Negative for back pain and neck pain.  Skin: Negative for rash.  Neurological: Negative for headaches.  Hematological: Does not bruise/bleed easily.  Psychiatric/Behavioral: Negative for confusion. The patient is nervous/anxious.      Physical Exam Updated Vital Signs BP 127/81   Pulse 91   Temp 98 F (36.7 C) (Oral)   Resp 17   Ht 1.88 m (6\' 2" )   Wt (!) 163.3 kg   SpO2 99%   BMI 46.22 kg/m   Physical Exam  Constitutional: He is oriented to person, place, and time. He appears well-developed and well-nourished.  Pt is hyperventilating. Anxious appearing.   HENT:  Mouth/Throat: Oropharynx is clear and moist.  Eyes: Pupils are equal, round, and reactive to light. Conjunctivae are normal.  Neck: Neck supple. No tracheal deviation present. No thyromegaly present.  Cardiovascular: Normal rate, regular rhythm, normal heart sounds and intact distal pulses. Exam reveals no gallop and no friction rub.  No murmur heard. Pulmonary/Chest: Effort normal and breath sounds normal. No accessory muscle usage. No respiratory distress.  Abdominal: Soft. Bowel sounds are normal. He exhibits no distension. There is no tenderness.  Musculoskeletal: He exhibits no edema or tenderness.  Neurological: He is alert and oriented to person, place, and time.  Speech clear/fluent. Ambulates w steady gait.   Skin: Skin is warm and dry.  Psychiatric: He has a normal mood and affect.  Nursing note and vitals reviewed.    ED  Treatments / Results  Labs (all labs ordered are listed, but only abnormal results are displayed) Results for orders placed or performed during the hospital encounter of 81/82/99  Basic metabolic panel  Result Value Ref Range   Sodium 136 135 - 145 mmol/L   Potassium 3.5 3.5 - 5.1 mmol/L   Chloride 104 98 - 111 mmol/L   CO2 18 (L) 22 - 32 mmol/L   Glucose, Bld 110 (H) 70 - 99 mg/dL   BUN 10 6 - 20 mg/dL   Creatinine, Ser 0.96 0.61 - 1.24 mg/dL   Calcium 9.1 8.9 - 10.3 mg/dL   GFR calc non Af Amer >60 >60 mL/min   GFR calc Af Amer >60 >60 mL/min  Anion gap 14 5 - 15  CBC  Result Value Ref Range   WBC 9.9 4.0 - 10.5 K/uL   RBC 4.08 (L) 4.22 - 5.81 MIL/uL   Hemoglobin 13.1 13.0 - 17.0 g/dL   HCT 39.2 39.0 - 52.0 %   MCV 96.1 78.0 - 100.0 fL   MCH 32.1 26.0 - 34.0 pg   MCHC 33.4 30.0 - 36.0 g/dL   RDW 13.9 11.5 - 15.5 %   Platelets 213 150 - 400 K/uL  Brain natriuretic peptide  Result Value Ref Range   B Natriuretic Peptide 36.2 0.0 - 100.0 pg/mL  D-dimer, quantitative (not at [facility])  Result Value Ref Range   D-Dimer, Quant <0.27 0.00 - 0.50 ug/mL-FEU  I-stat troponin, ED  Result Value Ref Range   Troponin i, poc 0.00 0.00 - 0.08 ng/mL   Comment 3           Dg Chest 2 View  Result Date: 10/27/2017 CLINICAL DATA:  52 y/o  M; dizzy with labored breathing for 2 days. EXAM: CHEST - 2 VIEW COMPARISON:  10/07/2017 chest radiograph. FINDINGS: Stable borderline cardiomegaly given projection and technique. Pulmonary vascular congestion. No consolidation, effusion, or pneumothorax. Multilevel degenerative changes of the spine. No acute osseous abnormality is evident. IMPRESSION: Stable pulmonary vascular congestion.  No consolidation. Electronically Signed   By: Kristine Garbe M.D.   On: 10/27/2017 18:43   Dg Chest 2 View  Result Date: 10/07/2017 CLINICAL DATA:  Near syncopal episode, dizziness, stumbling. EXAM: CHEST - 2 VIEW COMPARISON:  PA and lateral chest x-ray of September 24, 2017 FINDINGS: The lungs are well-expanded. There is no focal infiltrate. The interstitial markings are coarse though stable. There is no pleural effusion. The heart is top-normal in size. The pulmonary vascularity is mildly engorged which reflects a change. There is calcification in the wall of the aortic arch. There is mild multilevel degenerative disc disease of the thoracic spine. IMPRESSION: Chronic bronchitic changes. Probable low-grade CHF with no more than mild interstitial edema. No acute pneumonia. Thoracic aortic atherosclerosis. Electronically Signed   By: David  Martinique M.D.   On: 10/07/2017 13:28    EKG EKG Interpretation  Date/Time:  Sunday October 27 2017 18:12:14 EDT Ventricular Rate:  90 PR Interval:    QRS Duration: 90 QT Interval:  355 QTC Calculation: 435 R Axis:   1 Text Interpretation:  Sinus rhythm No significant change since last tracing Confirmed by Lajean Saver [phone]) on 10/27/2017 6:18:46 PM   Radiology Dg Chest 2 View  Result Date: 10/27/2017 CLINICAL DATA:  52 y/o  M; dizzy with labored breathing for 2 days. EXAM: CHEST - 2 VIEW COMPARISON:  10/07/2017 chest radiograph. FINDINGS: Stable borderline cardiomegaly given projection and technique. Pulmonary vascular congestion. No consolidation, effusion, or pneumothorax. Multilevel degenerative changes of the spine. No acute osseous abnormality is evident. IMPRESSION: Stable pulmonary vascular congestion.  No consolidation. Electronically Signed   By: Kristine Garbe M.D.   On: 10/27/2017 18:43    Procedures Procedures (including critical care time)  Medications Ordered in ED Medications  LORazepam (ATIVAN) tablet 1 mg (has no administration in time range)     Initial Impression / Assessment and Plan / ED Course  I have reviewed the triage vital signs and the nursing notes.  Pertinent labs & imaging results that were available during my care of the patient were reviewed by me and considered in  my medical decision making (see chart for details).  Monitor. Ecg.  Pt given reassurance.   Ativan po.  Po fluids.   Labs.   Reviewed nursing notes and prior charts for additional history.   Labs reviewed - na/k normal. Trop normal. bnp normal. ddimer normal.   Po fluids.  Recheck, normal vitals, pulse ox 100%. Pt feels improved. No dizziness. No pain or discomfort.   Recheck pt, afebrile. Pulse ox 99-100%.   Pt currently appears stable for d/c.     Final Clinical Impressions(s) / ED Diagnoses   Final diagnoses:  None    ED Discharge Orders    None           Lajean Saver, MD 10/27/17 2221

## 2017-10-27 NOTE — ED Notes (Signed)
Pt c/o dizziness and shakiness at this time, I instructed him to slow his breathing down as this is likely related

## 2017-12-13 ENCOUNTER — Encounter (HOSPITAL_COMMUNITY): Payer: Self-pay | Admitting: Nurse Practitioner

## 2017-12-13 ENCOUNTER — Emergency Department (HOSPITAL_COMMUNITY)
Admission: EM | Admit: 2017-12-13 | Discharge: 2017-12-14 | Disposition: A | Discharge status: Home / Self Care | Payer: BLUE CROSS/BLUE SHIELD | Attending: Emergency Medicine | Admitting: Emergency Medicine

## 2017-12-13 DIAGNOSIS — Z87891 Personal history of nicotine dependence: Secondary | ICD-10-CM | POA: Diagnosis not present

## 2017-12-13 DIAGNOSIS — F1014 Alcohol abuse with alcohol-induced mood disorder: Secondary | ICD-10-CM | POA: Diagnosis present

## 2017-12-13 DIAGNOSIS — Z79899 Other long term (current) drug therapy: Secondary | ICD-10-CM | POA: Insufficient documentation

## 2017-12-13 DIAGNOSIS — E785 Hyperlipidemia, unspecified: Secondary | ICD-10-CM | POA: Diagnosis not present

## 2017-12-13 DIAGNOSIS — E669 Obesity, unspecified: Secondary | ICD-10-CM | POA: Insufficient documentation

## 2017-12-13 DIAGNOSIS — R45851 Suicidal ideations: Secondary | ICD-10-CM | POA: Insufficient documentation

## 2017-12-13 DIAGNOSIS — I10 Essential (primary) hypertension: Secondary | ICD-10-CM | POA: Insufficient documentation

## 2017-12-13 DIAGNOSIS — F332 Major depressive disorder, recurrent severe without psychotic features: Secondary | ICD-10-CM | POA: Diagnosis not present

## 2017-12-13 LAB — CBC WITH DIFFERENTIAL/PLATELET
Abs Immature Granulocytes: 0.02 10*3/uL (ref 0.00–0.07)
Basophils Absolute: 0 10*3/uL (ref 0.0–0.1)
Basophils Relative: 0 %
Eosinophils Absolute: 0.2 10*3/uL (ref 0.0–0.5)
Eosinophils Relative: 3 %
HCT: 41.1 % (ref 39.0–52.0)
Hemoglobin: 13.5 g/dL (ref 13.0–17.0)
Immature Granulocytes: 0 %
Lymphocytes Relative: 34 %
Lymphs Abs: 2.5 10*3/uL (ref 0.7–4.0)
MCH: 32 pg (ref 26.0–34.0)
MCHC: 32.8 g/dL (ref 30.0–36.0)
MCV: 97.4 fL (ref 80.0–100.0)
Monocytes Absolute: 0.6 10*3/uL (ref 0.1–1.0)
Monocytes Relative: 8 %
Neutro Abs: 3.9 10*3/uL (ref 1.7–7.7)
Neutrophils Relative %: 55 %
Platelets: 266 10*3/uL (ref 150–400)
RBC: 4.22 MIL/uL (ref 4.22–5.81)
RDW: 13.9 % (ref 11.5–15.5)
WBC: 7.3 10*3/uL (ref 4.0–10.5)
nRBC: 0 % (ref 0.0–0.2)

## 2017-12-13 LAB — SALICYLATE LEVEL: Salicylate Lvl: <7 mg/dL (ref 2.8–30.0)

## 2017-12-13 LAB — COMPREHENSIVE METABOLIC PANEL
ALT: 21 U/L (ref 0–44)
AST: 24 U/L (ref 15–41)
Albumin: 3.8 g/dL (ref 3.5–5.0)
Alkaline Phosphatase: 82 U/L (ref 38–126)
Anion gap: 10 (ref 5–15)
BUN: 14 mg/dL (ref 6–20)
CO2: 20 mmol/L — ABNORMAL LOW (ref 22–32)
Calcium: 8.6 mg/dL — ABNORMAL LOW (ref 8.9–10.3)
Chloride: 112 mmol/L — ABNORMAL HIGH (ref 98–111)
Creatinine, Ser: 0.94 mg/dL (ref 0.61–1.24)
GFR calc Af Amer: >60 mL/min (ref >60)
GFR calc non Af Amer: >60 mL/min (ref >60)
Glucose, Bld: 90 mg/dL (ref 70–99)
Potassium: 3.6 mmol/L (ref 3.5–5.1)
Sodium: 142 mmol/L (ref 135–145)
Total Bilirubin: 0.7 mg/dL (ref 0.3–1.2)
Total Protein: 7.7 g/dL (ref 6.5–8.1)

## 2017-12-13 LAB — ACETAMINOPHEN LEVEL: Acetaminophen (Tylenol), Serum: <10 ug/mL — ABNORMAL LOW (ref 10–30)

## 2017-12-13 LAB — ETHANOL: Alcohol, Ethyl (B): 242 mg/dL — ABNORMAL HIGH (ref <10)

## 2017-12-13 MED ORDER — THIAMINE HCL 100 MG/ML IJ SOLN: 100.0000 mg | Freq: Every day | INTRAMUSCULAR | Status: DC

## 2017-12-13 MED ORDER — VITAMIN B-1 100 MG PO TABS
100.0000 mg | ORAL_TABLET | Freq: Every day | ORAL | Status: DC
  Administered 2017-12-14: 100 mg via ORAL
  Filled 2017-12-13: qty 1

## 2017-12-13 MED ORDER — LORAZEPAM 1 MG PO TABS
0.0000 mg | ORAL_TABLET | Freq: Four times a day (QID) | ORAL | Status: DC
  Administered 2017-12-14: 1 mg via ORAL
  Filled 2017-12-13: qty 1

## 2017-12-13 MED ORDER — LORAZEPAM 2 MG/ML IJ SOLN: 0.0000 mg | Freq: Two times a day (BID) | INTRAMUSCULAR | Status: DC

## 2017-12-13 MED ORDER — LORAZEPAM 1 MG PO TABS: 0.0000 mg | ORAL_TABLET | Freq: Two times a day (BID) | ORAL | Status: DC

## 2017-12-13 MED ORDER — LORAZEPAM 2 MG/ML IJ SOLN: 0.0000 mg | Freq: Four times a day (QID) | INTRAMUSCULAR | Status: DC

## 2017-12-13 NOTE — ED Notes (Signed)
Bed: WTR7 Expected date:  Expected time:  Means of arrival:  Comments: 

## 2017-12-13 NOTE — ED Provider Notes (Addendum)
[place] DEPT Provider Note   CSN: [id number] Arrival date & time: 12/13/17  2211     History   Chief Complaint Chief Complaint  Patient presents with  . Suicidal  . IVC'd    HPI Shaun Stokes is a 52 y.o. male with history of hypertension, hyperlipidemia, obesity, alcohol abuse who presents with suicidal ideation.  He reports he has been dealing with intermittent thoughts for the past several years, however he reports is been worse over the past week or so.  He has thought that his family would care he was gone.  He has thought about putting a knife through his chest and "getting it over with." He does not have access to firearms.  He drinks at least 1/5 of alcohol daily.  He reports he has drank 1/5 today.  He denies any other drug use.  He reports may be intermittently getting auditory hallucinations, however they are are noncommand and is unsure if he is just hearing something in the room or if he is hearing in his head.  He reports he takes Zoloft.  He has been seen at [facility] before, but not currently.  HPI  Past Medical History:  Diagnosis Date  . Chest pain   . Goiter   . Hyperlipemia   . Hypertension   . Insomnia   . Obesity   . OSA (obstructive sleep apnea) 08/24/2014  . Prediabetes   . Vitamin D deficiency     Patient Active Problem List   Diagnosis Date Noted  . Alcohol abuse with alcohol-induced mood disorder ([place]) 12/14/2017  . Essential hypertension 04/27/2015  . Dyspnea 04/26/2015  . OSA (obstructive sleep apnea) 08/24/2014  . Streptococcal sore throat 06/04/2014  . Pharyngitis 06/02/2014  . Leukocytosis 06/02/2014  . Severe obesity (BMI >= 40) ([place]) 06/02/2014  . Pharyngitis, acute 06/02/2014  . Prediabetes   . Vitamin D deficiency     Past Surgical History:  Procedure Laterality Date  . COLONOSCOPY WITH PROPOFOL N/A 09/16/2015   Procedure: COLONOSCOPY WITH PROPOFOL;  Surgeon: Carol Ada, MD;  Location: [facility] ENDOSCOPY;   Service: Endoscopy;  Laterality: N/A;  . NO PAST SURGERIES          Home Medications    Prior to Admission medications   Medication Sig Start Date End Date Taking? Authorizing Provider  carvedilol (COREG) 3.125 MG tablet Take 3.125 mg by mouth 2 (two) times daily. 08/23/15  Yes [provider]  furosemide (LASIX) 40 MG tablet Take 40 mg by mouth daily.  08/23/15  Yes [provider]  carbamazepine (TEGRETOL) 200 MG tablet Beginning on morning of 12/16/17: Take 600mg  PO QD X 1D, then 400mg  QD X 1D, then 200mg  PO QD X 2D 12/15/17   Antonietta Breach, PA-C  gabapentin (NEURONTIN) 300 MG capsule Take 1 capsule (300 mg total) by mouth 3 (three) times daily. 12/14/17   Patrecia Pour, NP  losartan (COZAAR) 25 MG tablet Take 1 tablet (25 mg total) by mouth daily. 12/15/17   Patrecia Pour, NP    Family History Family History  Problem Relation Age of Onset  . Heart attack Father     Social History Social History   Tobacco Use  . Smoking status: Former Smoker    Packs/day: 1.00    Years: 32.00    Pack years: 32.00    Types: Cigarettes    Last attempt to quit: 11/18/2014    Years since quitting: 3.0  . Smokeless tobacco: Never Used  Substance Use Topics  . Alcohol use: Yes    Comment: a fifth a day sometimes a pint  . Drug use: No     Allergies   Patient has no known allergies.   Review of Systems Review of Systems  Constitutional: Negative for chills and fever.  HENT: Negative for facial swelling and sore throat.   Respiratory: Negative for shortness of breath.   Cardiovascular: Negative for chest pain.  Gastrointestinal: Negative for abdominal pain, nausea and vomiting.  Genitourinary: Negative for dysuria.  Musculoskeletal: Negative for back pain.  Skin: Negative for rash and wound.  Neurological: Negative for headaches.  Psychiatric/Behavioral: Positive for suicidal ideas. The patient is not nervous/anxious.      Physical Exam Updated Vital Signs BP  (!) 163/103 (BP Location: Left Arm)   Pulse (!) 102   Temp 98.3 F (36.8 C) (Oral)   Resp 18   Ht 6' (1.829 m)   Wt (!) 154.2 kg   SpO2 99%   BMI 46.11 kg/m   Physical Exam  Constitutional: He appears well-developed and well-nourished. No distress.  Smells of alcohol  HENT:  Head: Normocephalic and atraumatic.  Mouth/Throat: Oropharynx is clear and moist. No oropharyngeal exudate.  Eyes: Pupils are equal, round, and reactive to light. Conjunctivae are normal. Right eye exhibits no discharge. Left eye exhibits no discharge. No scleral icterus.  Neck: Normal range of motion. Neck supple. No thyromegaly present.  Cardiovascular: Normal rate, regular rhythm, normal heart sounds and intact distal pulses. Exam reveals no gallop and no friction rub.  No murmur heard. Pulmonary/Chest: Effort normal and breath sounds normal. No stridor. No respiratory distress. He has no wheezes. He has no rales.  Abdominal: Soft. Bowel sounds are normal. He exhibits no distension. There is no tenderness. There is no rebound and no guarding.  Musculoskeletal: He exhibits no edema.  Lymphadenopathy:    He has no cervical adenopathy.  Neurological: He is alert. Coordination normal.  Skin: Skin is warm and dry. No rash noted. He is not diaphoretic. No pallor.  Psychiatric: He has a normal mood and affect. He is not actively hallucinating. He expresses suicidal ideation. He expresses no homicidal ideation. He expresses suicidal plans. He expresses no homicidal plans.  Nursing note and vitals reviewed.    ED Treatments / Results  Labs (all labs ordered are listed, but only abnormal results are displayed) Labs Reviewed  COMPREHENSIVE METABOLIC PANEL - Abnormal; Notable for the following components:      Result Value   Chloride 112 (*)    CO2 20 (*)    Calcium 8.6 (*)    All other components within normal limits  ETHANOL - Abnormal; Notable for the following components:   Alcohol, Ethyl (B) 242 (*)     All other components within normal limits  ACETAMINOPHEN LEVEL - Abnormal; Notable for the following components:   Acetaminophen (Tylenol), Serum <10 (*)    All other components within normal limits  RAPID URINE DRUG SCREEN, HOSP PERFORMED  CBC WITH DIFFERENTIAL/PLATELET  SALICYLATE LEVEL    EKG None  Radiology No results found.  Procedures Procedures (including critical care time)  Medications Ordered in ED Medications  ibuprofen (ADVIL,MOTRIN) tablet 400 mg (400 mg Oral Given 12/14/17 0843)     Initial Impression / Assessment and Plan / ED Course  I have reviewed the triage vital signs and the nursing notes.  Pertinent labs & imaging results that were available during my care of the patient were reviewed by me  and considered in my medical decision making (see chart for details).  Clinical Course as of Dec 17 1443  Sat Dec 14, 2017  1779 TTS recommends inpatient treatment.    [HM]    Clinical Course User Index [HM] Muthersbaugh, Jarrett Soho, PA-C    Patient presenting with suicidal ideation with plan.  Patient will be medically cleared pending stable labs.  Patient will need CIWA protocol as he is a heavy alcohol user.  CIWA protocol and psych holding orders placed.  Home medications ordered.  TTS pending disposition.  Final Clinical Impressions(s) / ED Diagnoses   Final diagnoses:  Alcohol abuse with alcohol-induced mood disorder [facility])    ED Discharge Orders         Ordered    losartan (COZAAR) 25 MG tablet  Daily     12/14/17 1057    gabapentin (NEURONTIN) 300 MG capsule  3 times daily     12/14/17 1057    Increase activity slowly     12/14/17 1057    Diet - low sodium heart healthy     12/14/17 1057    Discharge instructions    Comments:  Follow up with peer support resources   12/14/17 [address] Hershal Coria 12/14/17 0025    Frederica Kuster, PA-C 12/16/17 1445    Hayden Rasmussen, MD 12/19/17 [phone]

## 2017-12-13 NOTE — ED Triage Notes (Signed)
Pt states his life is out control (said with some profanities). He says alcohol has taken his life and it would not hurt anyone if he was dead.

## 2017-12-14 ENCOUNTER — Encounter (HOSPITAL_COMMUNITY): Payer: Self-pay | Admitting: Emergency Medicine

## 2017-12-14 ENCOUNTER — Emergency Department (HOSPITAL_COMMUNITY)
Admission: EM | Admit: 2017-12-14 | Discharge: 2017-12-15 | Disposition: A | Discharge status: Home / Self Care | Payer: BLUE CROSS/BLUE SHIELD | Source: Home / Self Care | Attending: Emergency Medicine | Admitting: Emergency Medicine

## 2017-12-14 ENCOUNTER — Inpatient Hospital Stay (HOSPITAL_COMMUNITY): Admission: AD | Admit: 2017-12-14 | Payer: BLUE CROSS/BLUE SHIELD | Source: Intra-hospital | Admitting: Psychiatry

## 2017-12-14 DIAGNOSIS — F1014 Alcohol abuse with alcohol-induced mood disorder: Secondary | ICD-10-CM | POA: Diagnosis present

## 2017-12-14 DIAGNOSIS — I110 Hypertensive heart disease with heart failure: Secondary | ICD-10-CM

## 2017-12-14 DIAGNOSIS — Z79899 Other long term (current) drug therapy: Secondary | ICD-10-CM | POA: Insufficient documentation

## 2017-12-14 DIAGNOSIS — F10230 Alcohol dependence with withdrawal, uncomplicated: Secondary | ICD-10-CM

## 2017-12-14 DIAGNOSIS — I509 Heart failure, unspecified: Secondary | ICD-10-CM | POA: Insufficient documentation

## 2017-12-14 DIAGNOSIS — Z87891 Personal history of nicotine dependence: Secondary | ICD-10-CM | POA: Insufficient documentation

## 2017-12-14 DIAGNOSIS — F10930 Alcohol use, unspecified with withdrawal, uncomplicated: Secondary | ICD-10-CM

## 2017-12-14 LAB — RAPID URINE DRUG SCREEN, HOSP PERFORMED
Amphetamines: NOT DETECTED
Barbiturates: NOT DETECTED
Benzodiazepines: NOT DETECTED
Cocaine: NOT DETECTED
Opiates: NOT DETECTED
Tetrahydrocannabinol: NOT DETECTED

## 2017-12-14 LAB — CBG MONITORING, ED: Glucose-Capillary: 88 mg/dL (ref 70–99)

## 2017-12-14 MED ORDER — LOSARTAN POTASSIUM 25 MG PO TABS: 25.0000 mg | ORAL_TABLET | Freq: Every day | ORAL | 0 refills | Status: AC

## 2017-12-14 MED ORDER — SERTRALINE HCL 50 MG PO TABS
50.0000 mg | ORAL_TABLET | Freq: Every day | ORAL | Status: DC
  Administered 2017-12-14: 50 mg via ORAL
  Filled 2017-12-14: qty 1

## 2017-12-14 MED ORDER — VITAMIN B-1 100 MG PO TABS
100.0000 mg | ORAL_TABLET | Freq: Every day | ORAL | Status: DC
  Administered 2017-12-14 – 2017-12-15 (×2): 100 mg via ORAL
  Filled 2017-12-14 (×2): qty 1

## 2017-12-14 MED ORDER — FUROSEMIDE 40 MG PO TABS
40.0000 mg | ORAL_TABLET | Freq: Every day | ORAL | Status: DC
  Administered 2017-12-14: 40 mg via ORAL
  Filled 2017-12-14: qty 1

## 2017-12-14 MED ORDER — LORAZEPAM 1 MG PO TABS
0.0000 mg | ORAL_TABLET | Freq: Four times a day (QID) | ORAL | Status: DC
  Administered 2017-12-14: 1 mg via ORAL
  Filled 2017-12-14: qty 1

## 2017-12-14 MED ORDER — IBUPROFEN 200 MG PO TABS
400.0000 mg | ORAL_TABLET | Freq: Once | ORAL | Status: AC
  Administered 2017-12-14: 400 mg via ORAL
  Filled 2017-12-14: qty 2

## 2017-12-14 MED ORDER — THIAMINE HCL 100 MG/ML IJ SOLN: 100.0000 mg | Freq: Every day | INTRAMUSCULAR | Status: DC

## 2017-12-14 MED ORDER — GABAPENTIN 300 MG PO CAPS: 300.0000 mg | ORAL_CAPSULE | Freq: Three times a day (TID) | ORAL | 0 refills | Status: AC

## 2017-12-14 MED ORDER — LORAZEPAM 1 MG PO TABS: 0.0000 mg | ORAL_TABLET | Freq: Two times a day (BID) | ORAL | Status: DC

## 2017-12-14 MED ORDER — LORAZEPAM 2 MG/ML IJ SOLN: 0.0000 mg | Freq: Two times a day (BID) | INTRAMUSCULAR | Status: DC

## 2017-12-14 MED ORDER — LORAZEPAM 2 MG/ML IJ SOLN: 0.0000 mg | Freq: Four times a day (QID) | INTRAMUSCULAR | Status: DC

## 2017-12-14 MED ORDER — LOSARTAN POTASSIUM 25 MG PO TABS
25.0000 mg | ORAL_TABLET | Freq: Every day | ORAL | Status: DC
  Administered 2017-12-14: 25 mg via ORAL
  Filled 2017-12-14: qty 1

## 2017-12-14 NOTE — BHH Counselor (Signed)
  [place] Disposition  Disposition: LPC discussed case with [place] provider, Patriciaann Clan, PA-C who recommends inpatient treatment due to patient meeting inpatient criteria for dual diagnosis.  LPC informed ER provider, Abigail Butts, PA-C and Clarise Cruz, RN of the recommended disposition. TTS will look for placement.  Stephany Poorman L. Rami Budhu, MS, [place], [facility] Therapeutic Triage Specialist  [phone]

## 2017-12-14 NOTE — ED Notes (Signed)
Pt is ready for discharge,  He has elevated BP but has recently taken his medication.  Pt denies S/I and H/I.  Peer support has met with pt.  All belongings were returned to pt.

## 2017-12-14 NOTE — ED Notes (Signed)
Pt oriented to room and unit.  Pt is calm and cooperative.  Pt denies S/I and H/I and contracts for safety.  Pt shows no signs of withdrawal at this time.  Will continue to monitor pt with 15 minute checks and video monitoring.

## 2017-12-14 NOTE — BHH Suicide Risk Assessment (Signed)
Suicide Risk Assessment  Discharge Assessment   [facility] Discharge Suicide Risk Assessment   Principal Problem: Alcohol abuse with alcohol-induced mood disorder [facility]) Discharge Diagnoses:  Patient Active Problem List   Diagnosis Date Noted  . Alcohol abuse with alcohol-induced mood disorder ([place]) [F10.14] 12/14/2017    Priority: High  . Essential hypertension [I10] 04/27/2015  . Dyspnea [R06.00] 04/26/2015  . OSA (obstructive sleep apnea) [G47.33] 08/24/2014  . Streptococcal sore throat [J02.0] 06/04/2014  . Pharyngitis [J02.9] 06/02/2014  . Leukocytosis [D72.829] 06/02/2014  . Severe obesity (BMI >= 40) ([place]) [E66.01] 06/02/2014  . Pharyngitis, acute [J02.9] 06/02/2014  . Prediabetes [R73.03]   . Vitamin D deficiency [E55.9]     Total Time spent with patient: 45 minutes  Musculoskeletal: Strength & Muscle Tone: within normal limits Gait & Station: normal Patient leans: N/A  Psychiatric Specialty Exam:   Blood pressure 138/80, pulse 95, temperature 98.1 F (36.7 C), temperature source Oral, resp. rate 16, height 6' (1.829 m), weight (!) 154.2 kg, SpO2 98 %.Body mass index is 46.11 kg/m.  General Appearance: Casual  Eye Contact::  Good  Speech:  Normal Rate409  Volume:  Normal  Mood:  Euthymic  Affect:  Congruent  Thought Process:  Coherent and Descriptions of Associations: Intact  Orientation:  Full (Time, Place, and Person)  Thought Content:  WDL and Logical  Suicidal Thoughts:  No  Homicidal Thoughts:  No  Memory:  Immediate;   Good Recent;   Good Remote;   Good  Judgement:  Fair  Insight:  Fair  Psychomotor Activity:  Normal  Concentration:  Good  Recall:  Good  Fund of Knowledge:Good  Language: Good  Akathisia:  No  Handed:  Right  AIMS (if indicated):     Assets:  Leisure Time Physical Health Resilience  Sleep:     Cognition: WNL  ADL's:  Intact   Mental Status Per Nursing Assessment::   On Admission:   52 yo male who presented to the hospital with  alcohol intoxication with passive suicidal ideations.  On assessment, he was clear and coherent after sleeping and eating with no suicidal/homicidal ideations, hallucinations, or withdrawal symptoms.  Peer support consult placed.  Stable for discharge.  Demographic Factors:  Male  Loss Factors: NA  Historical Factors: NA  Risk Reduction Factors:   Sense of responsibility to family, Living with another person, especially a relative and Positive social support  Continued Clinical Symptoms:  None  Cognitive Features That Contribute To Risk:  None    Suicide Risk:  Minimal: No identifiable suicidal ideation.  Patients presenting with no risk factors but with morbid ruminations; may be classified as minimal risk based on the severity of the depressive symptoms    Plan Of Care/Follow-up recommendations:  Activity:  as tolerated Diet:  heart healthy diet  Kanon Colunga, NP 12/14/2017, 10:57 AM

## 2017-12-14 NOTE — BH Assessment (Signed)
Tele Assessment Note   Patient Name: Shaun Stokes MRN: [id number] Referring Physician: Dr. Rebeca Alert. Melina Copa, MD Location of Patient:  [facility] Location of Provider: [place]  Shaun Stokes is an 52 y.o. male who voluntarily came to [facility] due to having suicidal thoughts.  Pt states "my suicidal thoughts have increased the past 1-2 days. I usually have suicide thoughts when I drink alcohol or when I'm going through alcohol withdrawal."  Pt continues "I just can't take it anymore and I need help." Pt denies having a suicide plan.  Pt reports "drinking 1/5 of liquor daily" and "the longest without alcohol is 3-4 days."  Pt denies currently using any other substances.  Pt denies having homicidal ideations.  Pt denies A/V-hallucinations.  Pt was not able to contract for safety.  Pt report receiving treatment for 2 yrs at [facility] for anxiety, depression, and medication management.  Pt states his therapist at [facility] is [place].  Pt reports living alone and working a "good job".  Pt reports "going through a 2 year program at [facility] in 2003 to get off of crack."  Pt stated "I stopped using marijuana 27 yrs ago when my daughter was born."  Pt denies having a history a physical, sexual, or verbal abuse.  Patient was wearing scrubs and appeared appropriately groomed.  Pt was alert throughout the assessment.  Patient made fair eye contact and had normal psychomotor activity.  Patient spoke in a normal voice without pressured speech.  Pt expressed feeling suicidal.  Pt's affect appeared dysphoric/depressed,  and congruent with stated mood. Pt's thought process was logical and coherrent.  Pt presented with partial insight and judgement. Pt did not appear to be responding to internal stimuli.  Pt was not able to contract for safety.  Disposition: LPC discussed case with [place] provider, Patriciaann Clan, PA-C who recommends inpatient treatment due to patient meeting inpatient  criteria for dual diagnosis.  LPC informed ER provider, Abigail Butts, PA-C and Clarise Cruz, RN of the recommended disposition. TTS will look for placement.  Diagnosis: Major Depressive Disorder, Severe; Alcohol Use Disorder, Severe  Past Medical History:  Past Medical History:  Diagnosis Date  . Chest pain   . Goiter   . Hyperlipemia   . Hypertension   . Insomnia   . Obesity   . OSA (obstructive sleep apnea) 08/24/2014  . Prediabetes   . Vitamin D deficiency     Past Surgical History:  Procedure Laterality Date  . COLONOSCOPY WITH PROPOFOL N/A 09/16/2015   Procedure: COLONOSCOPY WITH PROPOFOL;  Surgeon: Carol Ada, MD;  Location: [facility];  Service: Endoscopy;  Laterality: N/A;  . NO PAST SURGERIES      Family History:  Family History  Problem Relation Age of Onset  . Heart attack Father     Social History:  reports that he quit smoking about 3 years ago. His smoking use included cigarettes. He has a 32.00 pack-year smoking history. He has never used smokeless tobacco. He reports that he drinks alcohol. He reports that he does not use drugs.  Additional Social History:  Alcohol / Drug Use Pain Medications: See MARs Prescriptions: See MARs Over the Counter: See MARs History of alcohol / drug use?: Yes Longest period of sobriety (when/how long): "Longest period of sobriety from alcohol is 3-4 days (16 yrs crack, 27 yrs marijuana)" Negative Consequences of Use: Personal relationships Substance #1 Name of Substance 1: Alcohol 1 - Age of First Use: unknown 1 -  Amount (size/oz): 1/5 liquor 1 - Frequency: daily 1 - Duration: ongoing 1 - Last Use / Amount: PTA Substance #2 Name of Substance 2: Marijuana 2 - Age of First Use: "early 50s" 2 - Amount (size/oz): unknown 2 - Frequency: no longer use 2 - Duration: no longer use 2 - Last Use / Amount: "27  yrs ago" Substance #3 Name of Substance 3: Crack Cocaine 3 - Age of First Use: 52 y/o 3 - Amount (size/oz):  unknown 3 - Frequency: no longer use 3 - Duration: no longer use 3 - Last Use / Amount: "16 yrs ago"  CIWA: CIWA-Ar BP: 129/87 Pulse Rate: 90 COWS:    Allergies: No Known Allergies  Home Medications:  (Not in a hospital admission)  OB/GYN Status:  No LMP for male patient.  General Assessment Data [facility] of Assessment: [facility] TTS Assessment: In system Is this a Tele or Face-to-Face Assessment?: Tele Assessment Is this an Initial Assessment or a Re-assessment for this encounter?: Initial Assessment Patient Accompanied by:: N/A Language Other than English: No Living Arrangements: Other (Comment) What gender do you identify as?: Male Marital status: Single Living Arrangements: Alone Can pt return to current living arrangement?: Yes Admission Status: Voluntary Is patient capable of signing voluntary admission?: Yes Referral Source: Self/Family/Friend(Sister referred pt) Insurance type: [place] Living Arrangements: Alone Legal Guardian: Other:(Self) Name of Psychiatrist: Beverly Sessions Name of Therapist: Ardyth Gal  Education Status Is patient currently in school?: No Is the patient employed, unemployed or receiving disability?: Employed  Risk to self with the past 6 months Suicidal Ideation: Yes-Currently Present(While drinking or withdrawing) Has patient been a risk to self within the past 6 months prior to admission? : Yes Suicidal Intent: No Has patient had any suicidal intent within the past 6 months prior to admission? : No Is patient at risk for suicide?: Yes Suicidal Plan?: No(pt denies having a plan) Has patient had any suicidal plan within the past 6 months prior to admission? : No Access to Means: No What has been your use of drugs/alcohol within the last 12 months?: Alcohol Previous Attempts/Gestures: No Triggers for Past Attempts: None known Intentional Self Injurious Behavior: None Family Suicide History: No Recent stressful life event(s):  Loss (Comment), Other (Comment)(Totalled car) Persecutory voices/beliefs?: No Depression: Yes Depression Symptoms: Tearfulness, Isolating, Guilt, Loss of interest in usual pleasures, Feeling worthless/self pity, Feeling angry/irritable Substance abuse history and/or treatment for substance abuse?: Yes Suicide prevention information given to non-admitted patients: Not applicable  Risk to Others within the past 6 months Homicidal Ideation: No Does patient have any lifetime risk of violence toward others beyond the six months prior to admission? : No Thoughts of Harm to Others: No Current Homicidal Intent: No Current Homicidal Plan: No Access to Homicidal Means: No History of harm to others?: No Assessment of Violence: None Noted Does patient have access to weapons?: No Criminal Charges Pending?: No Does patient have a court date: No Is patient on probation?: No  Psychosis Hallucinations: None noted Delusions: None noted  Mental Status Report Appearance/Hygiene: In scrubs Eye Contact: Poor Motor Activity: Agitation Speech: Logical/coherent Level of Consciousness: Quiet/awake, Restless Mood: Depressed, Anxious Affect: Anxious, Appropriate to circumstance, Depressed Anxiety Level: Moderate Thought Processes: Coherent, Relevant Judgement: Partial Orientation: Person, Place, Time, Appropriate for developmental age Obsessive Compulsive Thoughts/Behaviors: None  Cognitive Functioning Concentration: Normal Memory: Recent Intact, Remote Intact Is patient IDD: No Insight: Fair Impulse Control: Poor Appetite: Fair Have you had any weight changes? :  No Change Sleep: No Change Total Hours of Sleep: 5 Vegetative Symptoms: None  ADLScreening [facility] Assessment Services) Patient's cognitive ability adequate to safely complete daily activities?: Yes Patient able to express need for assistance with ADLs?: Yes Independently performs ADLs?: Yes (appropriate for developmental  age)  Prior Inpatient Therapy Prior Inpatient Therapy: No  Prior Outpatient Therapy Prior Outpatient Therapy: Yes Prior Therapy Dates: 2003 Prior Therapy Facilty/Provider(s): [facility] Reason for Treatment: Detox Does patient have an ACCT team?: No Does patient have Intensive In-House Services?  : No Does patient have [facility] services? : Yes Does patient have P4CC services?: No  ADL Screening (condition at time of admission) Patient's cognitive ability adequate to safely complete daily activities?: Yes Is the patient deaf or have difficulty hearing?: No Does the patient have difficulty seeing, even when wearing glasses/contacts?: No Does the patient have difficulty concentrating, remembering, or making decisions?: No Patient able to express need for assistance with ADLs?: Yes Does the patient have difficulty dressing or bathing?: No Independently performs ADLs?: Yes (appropriate for developmental age) Does the patient have difficulty walking or climbing stairs?: No Weakness of Legs: None Weakness of Arms/Hands: None  Home Assistive Devices/Equipment Home Assistive Devices/Equipment: None    Abuse/Neglect Assessment (Assessment to be complete while patient is alone) Abuse/Neglect Assessment Can Be Completed: Yes Physical Abuse: Denies Verbal Abuse: Denies Sexual Abuse: Denies Exploitation of patient/patient's resources: Denies Self-Neglect: Denies Values / Beliefs Cultural Requests During Hospitalization: None Spiritual Requests During Hospitalization: None Consults Spiritual Care Consult Needed: No Social Work Consult Needed: No Regulatory affairs officer (For Healthcare) Does Patient Have a Medical Advance Directive?: No Would patient like information on creating a medical advance directive?: No - Patient declined          Disposition: LPC discussed case with [place] provider, Patriciaann Clan, PA-C who recommends inpatient treatment due to patient meeting inpatient criteria for  dual diagnosis.  LPC informed ER provider, Abigail Butts, PA-C and Clarise Cruz, RN of the recommended disposition. TTS will look for placement.  Disposition Initial Assessment Completed for this Encounter: Yes  This service was provided via telemedicine using a 2-way, interactive audio and video technology.  Names of all persons participating in this telemedicine service and their role in this encounter. Name: Chase Caller Role: Patient  Name: Gunda Maqueda L. Makaelyn Aponte, MS, LPC, [place] Role: Therapist  Name: Patriciaann Clan, PA-C Role: [facility] Provider  Name:  Role:     [place] 12/14/2017 5:21 AM

## 2017-12-14 NOTE — Patient Outreach (Signed)
ED Peer Support Specialist Patient Intake (Complete at intake & 30-60 Day Follow-up)  Name: Shaun Stokes  MRN: [id number]  Age: 52 y.o.   Date of Admission: 12/14/2017  Intake: Initial Comments:      Primary Reason Admitted: Pt states his life is out control (said with some profanities). He says alcohol has taken his life   Lab values: Alcohol/ETOH: Positive Positive UDS? No Amphetamines: No Barbiturates: No Benzodiazepines: No Cocaine: No Opiates: No Cannabinoids: No  Demographic information: Gender: Male Ethnicity: African American Marital Status: Single Insurance Status: Diplomatic Services operational officer (Work Neurosurgeon, Physicist, medical, etc.: No Lives with: Alone Living situation: House/Apartment  Reported Patient History: Patient reported health conditions: Anxiety disorders, Depression Patient aware of HIV and hepatitis status: No  In past year, has patient visited ED for any reason? No  Number of ED visits:    Reason(s) for visit:    In past year, has patient been hospitalized for any reason? No  Number of hospitalizations:    Reason(s) for hospitalization:    In past year, has patient been arrested? No  Number of arrests:    Reason(s) for arrest:    In past year, has patient been incarcerated? No  Number of incarcerations:    Reason(s) for incarceration:    In past year, has patient received medication-assisted treatment? No  In past year, patient received the following treatments:    In past year, has patient received any harm reduction services? No  Did this include any of the following?    In past year, has patient received care from a mental health provider for diagnosis other than SUD? No  In past year, is this first time patient has overdosed? No  Number of past overdoses:    In past year, is this first time patient has been hospitalized for an overdose? No  Number of hospitalizations for overdose(s):    Is  patient currently receiving treatment for a mental health diagnosis? No  Patient reports experiencing difficulty participating in SUD treatment: No    Most important reason(s) for this difficulty?    Has patient received prior services for treatment? No  In past, patient has received services from following agencies:    Plan of Care:  Suggested follow up at these agencies/treatment centers: [facility] ([facility])  Other information: CPSS processed with Pt and was able to understand Pt reason for visiting the ER. CPSS gain information to better assist Pt with his concerns that he needs to address. CPSS discussed a few options that may help Pt feel better about himself. CPSS was made aware that he does not want to attend a facility at the moment because he just started a new job. CPSS praised Pt and gave contact information for when Pt feels ready to seek outpatient help.   [name], CPSS  12/14/2017 11:26 AM

## 2017-12-14 NOTE — BH Assessment (Signed)
  LPC contacted triage nurse requesting that the tele-chart be placed in patient's room to complete [facility] Assessment.    Rutha Melgoza L. Roux Brandy, MS, [place], [facility] Therapeutic Triage Specialist  [phone]

## 2017-12-14 NOTE — ED Triage Notes (Signed)
Pt presents ambulatory with PTAR from [facility] for ETOH detox; pt sent here with "unstable VS", DTs and CHF; pt c/o anxiety rate 4/10 intensity; PTAR VS enroute 150/pal, 100HR, 99% room air

## 2017-12-14 NOTE — ED Provider Notes (Signed)
[facility] EMERGENCY DEPARTMENT Provider Note   CSN: [id number] Arrival date & time: 12/14/17  2136     History   Chief Complaint Chief Complaint  Patient presents with  . Withdrawal    HPI Shaun Stokes is a 52 y.o. male.   52 y.o. male with history of hypertension, hyperlipidemia, obesity, alcohol abuse presents to the emergency department for evaluation.  He was deemed unstable by [facility] due to concerns for acute withdrawal as well as CHF.  Patient states that he has no specific complaints.  He only notes that he feels "shaky" and a little anxious.  He states that these symptoms are similar to prior episodes of alcohol withdrawal.  Denies any history of seizures secondary to withdrawal.  He has no complaints of chest pain or shortness of breath.  Was discharged this morning from [facility] and went directly to [facility].  Was deemed stable from a psychiatric perspective after being evaluated by psychiatry this morning.  Discharged without any complaints of SI or HI.     Past Medical History:  Diagnosis Date  . Chest pain   . Goiter   . Hyperlipemia   . Hypertension   . Insomnia   . Obesity   . OSA (obstructive sleep apnea) 08/24/2014  . Prediabetes   . Vitamin D deficiency     Patient Active Problem List   Diagnosis Date Noted  . Alcohol abuse with alcohol-induced mood disorder ([place]) 12/14/2017  . Essential hypertension 04/27/2015  . Dyspnea 04/26/2015  . OSA (obstructive sleep apnea) 08/24/2014  . Streptococcal sore throat 06/04/2014  . Pharyngitis 06/02/2014  . Leukocytosis 06/02/2014  . Severe obesity (BMI >= 40) ([place]) 06/02/2014  . Pharyngitis, acute 06/02/2014  . Prediabetes   . Vitamin D deficiency     Past Surgical History:  Procedure Laterality Date  . COLONOSCOPY WITH PROPOFOL N/A 09/16/2015   Procedure: COLONOSCOPY WITH PROPOFOL;  Surgeon: Carol Ada, MD;  Location[facility];  Service: Endoscopy;  Laterality: N/A;  . NO PAST  SURGERIES          Home Medications    Prior to Admission medications   Medication Sig Start Date End Date Taking? Authorizing Provider  carvedilol (COREG) 3.125 MG tablet Take 3.125 mg by mouth 2 (two) times daily. 08/23/15  Yes [provider]  furosemide (LASIX) 40 MG tablet Take 40 mg by mouth daily.  08/23/15  Yes [provider]  carbamazepine (TEGRETOL) 200 MG tablet Beginning on morning of 12/16/17: Take 600mg  PO QD X 1D, then 400mg  QD X 1D, then 200mg  PO QD X 2D 12/15/17   Antonietta Breach, PA-C  gabapentin (NEURONTIN) 300 MG capsule Take 1 capsule (300 mg total) by mouth 3 (three) times daily. 12/14/17   Patrecia Pour, NP  losartan (COZAAR) 25 MG tablet Take 1 tablet (25 mg total) by mouth daily. 12/15/17   Patrecia Pour, NP    Family History Family History  Problem Relation Age of Onset  . Heart attack Father     Social History Social History   Tobacco Use  . Smoking status: Former Smoker    Packs/day: 1.00    Years: 32.00    Pack years: 32.00    Types: Cigarettes    Last attempt to quit: 11/18/2014    Years since quitting: 3.0  . Smokeless tobacco: Never Used  Substance Use Topics  . Alcohol use: Yes    Comment: a fifth a day sometimes a pint  .  Drug use: No     Allergies   Patient has no known allergies.   Review of Systems Review of Systems Ten systems reviewed and are negative for acute change, except as noted in the HPI.    Physical Exam Updated Vital Signs BP (!) 135/97 (BP Location: Left Arm)   Pulse 95   Temp (!) 97.5 F (36.4 C) (Oral)   Resp 16   SpO2 92%   Physical Exam  Constitutional: He is oriented to person, place, and time. He appears well-developed and well-nourished. No distress.  Nontoxic appearing and in NAD  HENT:  Head: Normocephalic and atraumatic.  Eyes: Conjunctivae and EOM are normal. No scleral icterus.  Neck: Normal range of motion.  Cardiovascular: Normal rate, regular rhythm and intact distal  pulses.  Pulmonary/Chest: Effort normal. No respiratory distress.  Respirations even and unlabored  Musculoskeletal: Normal range of motion.  Neurological: He is alert and oriented to person, place, and time. He exhibits normal muscle tone. Coordination normal.  Skin: Skin is warm and dry. No rash noted. He is not diaphoretic. No erythema. No pallor.  Psychiatric: He has a normal mood and affect. His behavior is normal.  Calm, cooperative.  Nursing note and vitals reviewed.    ED Treatments / Results  Labs (all labs ordered are listed, but only abnormal results are displayed) Labs Reviewed  CBG MONITORING, ED    EKG EKG Interpretation  Date/Time:  Saturday December 14 2017 21:44:57 EDT Ventricular Rate:  88 PR Interval:    QRS Duration: 88 QT Interval:  351 QTC Calculation: 425 R Axis:   23 Text Interpretation:  Sinus rhythm Confirmed by Elnora Morrison [phone]) on 12/14/2017 11:02:28 PM   Radiology No results found.  Procedures Procedures (including critical care time)  Medications Ordered in ED Medications  thiamine (VITAMIN B-1) tablet 100 mg (100 mg Oral Given 12/14/17 2310)    Or  thiamine (B-1) injection 100 mg ( Intravenous See Alternative 12/14/17 2310)  carvedilol (COREG) tablet 3.125 mg (3.125 mg Oral Given 12/15/17 0033)  furosemide (LASIX) tablet 40 mg (has no administration in time range)  gabapentin (NEURONTIN) capsule 300 mg (has no administration in time range)  losartan (COZAAR) tablet 25 mg (has no administration in time range)  carbamazepine (TEGRETOL) tablet 800 mg (has no administration in time range)     Initial Impression / Assessment and Plan / ED Course  I have reviewed the triage vital signs and the nursing notes.  Pertinent labs & imaging results that were available during my care of the patient were reviewed by me and considered in my medical decision making (see chart for details).     52 year old male presents to the emergency  department from [facility].  [facility] expressed concern that the patient was having acute alcohol withdrawal and was too unstable for their facility.  He has been hemodynamically stable since arrival to the ED.  Complains of feeling shaky and anxious.  Was given a tablet of Ativan for this.  Patient continued on his daily medications.  He has no complaints of chest pain or shortness of breath.  He has never had hypoxia.  No severe hypertension or tachycardia.  He has been observed for multiple hours without clinical decompensation.  Patient able to be transferred back to [facility] this morning.  [facility] to arrange cab pickup for the patient after shift change.  He was given a prescription for a Tegretol taper to help manage with withdrawal symptoms as an outpatient.  No  indication for further emergent work up at this time.   Vitals:   12/15/17 0015 12/15/17 0030 12/15/17 0045 12/15/17 0614  BP: 129/83 130/79 135/74 (!) 135/97  Pulse: 81 82 81 95  Resp: 16 (!) 22 19 16   Temp:    (!) 97.5 F (36.4 C)  TempSrc:    Oral  SpO2: 98% 100% 95% 92%    Final Clinical Impressions(s) / ED Diagnoses   Final diagnoses:  Alcohol withdrawal syndrome without complication [facility])    ED Discharge Orders         Ordered    carbamazepine (TEGRETOL) 200 MG tablet     12/15/17 0611           Antonietta Breach, PA-C 12/15/17 2010    Elnora Morrison, MD 12/18/17 [phone]

## 2017-12-15 ENCOUNTER — Other Ambulatory Visit: Payer: Self-pay

## 2017-12-15 MED ORDER — CARBAMAZEPINE 200 MG PO TABS: ORAL_TABLET | ORAL | 0 refills | Status: DC

## 2017-12-15 MED ORDER — CARVEDILOL 3.125 MG PO TABS
3.1250 mg | ORAL_TABLET | Freq: Two times a day (BID) | ORAL | Status: DC
  Administered 2017-12-15 (×2): 3.125 mg via ORAL
  Filled 2017-12-15 (×2): qty 1

## 2017-12-15 MED ORDER — LOSARTAN POTASSIUM 50 MG PO TABS
25.0000 mg | ORAL_TABLET | Freq: Every day | ORAL | Status: DC
  Administered 2017-12-15: 25 mg via ORAL
  Filled 2017-12-15: qty 1
  Filled 2017-12-15: qty 2

## 2017-12-15 MED ORDER — FUROSEMIDE 20 MG PO TABS
40.0000 mg | ORAL_TABLET | Freq: Every day | ORAL | Status: DC
  Administered 2017-12-15: 40 mg via ORAL
  Filled 2017-12-15: qty 2
  Filled 2017-12-15: qty 3

## 2017-12-15 MED ORDER — CARBAMAZEPINE 200 MG PO TABS
800.0000 mg | ORAL_TABLET | Freq: Once | ORAL | Status: AC
  Administered 2017-12-15: 800 mg via ORAL
  Filled 2017-12-15: qty 4

## 2017-12-15 MED ORDER — GABAPENTIN 300 MG PO CAPS
300.0000 mg | ORAL_CAPSULE | Freq: Three times a day (TID) | ORAL | Status: DC
  Administered 2017-12-15: 300 mg via ORAL
  Filled 2017-12-15: qty 9
  Filled 2017-12-15: qty 1

## 2017-12-15 NOTE — ED Notes (Signed)
Shaun Stokes, called and advised the cab "[employer]" will be picking him up in approx 10 min. Pt noted w/all of his belongings in room - bag of meds from pharmacy given to pt to take w/him to [facility] along w/pt's d/c papers.

## 2017-12-15 NOTE — ED Notes (Signed)
This RN spoke with [place] at [facility] who has confirmed that [facility] will arrange for Cab pick up for patient after shift change later this morning-Monique,RN

## 2017-12-15 NOTE — ED Notes (Addendum)
[place], [place], [phone]) able to accept patient back in the morning as long as [facility] can provide 2 days worth of daily medications. He said he would coordinate taxi to pick him up and bring him back to [facility]. Med reconciliation has been done and pharmacy would be able to provide "doggie bag" of meds to patient upon discharge

## 2017-12-15 NOTE — ED Notes (Signed)
Patient updated on plan.

## 2017-12-15 NOTE — Discharge Instructions (Addendum)
Continue your daily medications.  We recommend that you continue a Tegretol taper to help manage your alcohol withdrawal symptoms.  Follow-up with [facility] as scheduled.  You would also benefit from follow-up with your primary care doctor.

## 2017-12-15 NOTE — ED Notes (Signed)
Pt received breakfast tray 

## 2017-12-15 NOTE — ED Notes (Signed)
Hard copy scripts for home medications sent/tubed  to Pharmacy to be filled-Monique,RN

## 2017-12-15 NOTE — ED Notes (Signed)
Per Nelva Bush, [phone] - has attempted to contact cab to pick up pt. Will call RN when cab returns call.

## 2017-12-30 ENCOUNTER — Encounter (HOSPITAL_COMMUNITY): Payer: Self-pay

## 2017-12-30 ENCOUNTER — Emergency Department (HOSPITAL_COMMUNITY)
Admission: EM | Admit: 2017-12-30 | Discharge: 2017-12-30 | Disposition: A | Discharge status: Home / Self Care | Payer: BLUE CROSS/BLUE SHIELD | Attending: Emergency Medicine | Admitting: Emergency Medicine

## 2017-12-30 DIAGNOSIS — Z79899 Other long term (current) drug therapy: Secondary | ICD-10-CM | POA: Insufficient documentation

## 2017-12-30 DIAGNOSIS — I10 Essential (primary) hypertension: Secondary | ICD-10-CM | POA: Insufficient documentation

## 2017-12-30 DIAGNOSIS — Z87891 Personal history of nicotine dependence: Secondary | ICD-10-CM | POA: Insufficient documentation

## 2017-12-30 DIAGNOSIS — F1020 Alcohol dependence, uncomplicated: Secondary | ICD-10-CM

## 2017-12-30 DIAGNOSIS — R42 Dizziness and giddiness: Secondary | ICD-10-CM

## 2017-12-30 LAB — BASIC METABOLIC PANEL
Anion gap: 11 (ref 5–15)
BUN: 9 mg/dL (ref 6–20)
CO2: 21 mmol/L — ABNORMAL LOW (ref 22–32)
CREATININE: 1.14 mg/dL (ref 0.61–1.24)
Calcium: 8.5 mg/dL — ABNORMAL LOW (ref 8.9–10.3)
Chloride: 105 mmol/L (ref 98–111)
GFR calc Af Amer: >60 mL/min (ref >60)
Glucose, Bld: 102 mg/dL — ABNORMAL HIGH (ref 70–99)
Potassium: 3.8 mmol/L (ref 3.5–5.1)
SODIUM: 137 mmol/L (ref 135–145)

## 2017-12-30 LAB — CBC
HCT: 40.8 % (ref 39.0–52.0)
Hemoglobin: 13.2 g/dL (ref 13.0–17.0)
MCH: 31.4 pg (ref 26.0–34.0)
MCHC: 32.4 g/dL (ref 30.0–36.0)
MCV: 96.9 fL (ref 80.0–100.0)
Platelets: 253 10*3/uL (ref 150–400)
RBC: 4.21 MIL/uL — ABNORMAL LOW (ref 4.22–5.81)
RDW: 13.9 % (ref 11.5–15.5)
WBC: 6.5 10*3/uL (ref 4.0–10.5)
nRBC: 0 % (ref 0.0–0.2)

## 2017-12-30 MED ORDER — LORAZEPAM 2 MG/ML IJ SOLN: 1.0000 mg | Freq: Once | INTRAMUSCULAR | Status: DC

## 2017-12-30 MED ORDER — CARBAMAZEPINE 200 MG PO TABS: ORAL_TABLET | ORAL | 0 refills | Status: DC

## 2017-12-30 MED ORDER — VITAMIN B-1 100 MG PO TABS
100.0000 mg | ORAL_TABLET | Freq: Once | ORAL | Status: AC
  Administered 2017-12-30: 100 mg via ORAL
  Filled 2017-12-30: qty 1

## 2017-12-30 MED ORDER — SODIUM CHLORIDE 0.9 % IV BOLUS
500.0000 mL | Freq: Once | INTRAVENOUS | Status: AC
  Administered 2017-12-30: 500 mL via INTRAVENOUS

## 2017-12-30 MED ORDER — LORAZEPAM 1 MG PO TABS
1.0000 mg | ORAL_TABLET | Freq: Once | ORAL | Status: AC
  Administered 2017-12-30: 1 mg via ORAL
  Filled 2017-12-30: qty 1

## 2017-12-30 MED ORDER — LORAZEPAM 2 MG/ML IJ SOLN
0.5000 mg | Freq: Once | INTRAMUSCULAR | Status: AC
  Administered 2017-12-30: 0.5 mg via INTRAVENOUS
  Filled 2017-12-30: qty 1

## 2017-12-30 MED ORDER — CARBAMAZEPINE 200 MG PO TABS
200.0000 mg | ORAL_TABLET | Freq: Once | ORAL | Status: AC
  Administered 2017-12-30: 200 mg via ORAL
  Filled 2017-12-30: qty 1

## 2017-12-30 NOTE — ED Triage Notes (Signed)
Pt states that he was at work and felt his BP was high due to feeling dizzy.

## 2017-12-30 NOTE — ED Notes (Signed)
Pt did well ambulated in hall. No SOB and light headedness, and no dizzy. Pt stated that he felt fine. PA and bed side

## 2017-12-30 NOTE — ED Provider Notes (Signed)
[facility] EMERGENCY DEPARTMENT Provider Note   CSN: [id number] Arrival date & time: 12/30/17  0550     History   Chief Complaint Chief Complaint  Patient presents with  . Dizziness    HPI Shaun Stokes is a 52 y.o. male with a history of alcohol use disorder, hypertension, hyperlipidemia, anxiety, OSA, and prediabetes who presents to the emergency department with a chief complaint of dizziness.  The patient reports sudden onset, constant dizziness that began while he was at work at 4 AM.  He works as a Furniture conservator/restorer.  He reports dizziness has minimally improved since onset.  He states that he was concerned that maybe his blood pressure was high so he came to the ER for evaluation.  He did not check his blood pressure prior to arrival in the ED.  He also reports he is feeling anxious.  He denies chest pain, dyspnea, numbness, weakness, tinnitus, otalgia, visual changes, fever, chills, neck pain or stiffness, tremor, seizure-like activity, abdominal pain, nausea, vomiting, diarrhea, confusion, slurred speech, or difficulty walking.  He reports he has been drinking more than a pint of vodka daily for the last 10 days.  Last drink was at 4 PM yesterday afternoon.  He denies a history of DTs or seizures from withdrawal.  Reports he recently went through detox, but about 10 days ago he felt an overwhelming urge to drink so he started drinking again.  He states that he would like assistance with stopping drinking.  He is also a current, every day smoker.  He denies other IV recreational drug use.  He denies SI, HI, or auditory or visual hallucinations.  The history is provided by the patient. No language interpreter was used.    Past Medical History:  Diagnosis Date  . Chest pain   . Goiter   . Hyperlipemia   . Hypertension   . Insomnia   . Obesity   . OSA (obstructive sleep apnea) 08/24/2014  . Prediabetes   . Vitamin D deficiency     Patient Active Problem List   Diagnosis Date Noted  . Alcohol abuse with alcohol-induced mood disorder ([place]) 12/14/2017  . Essential hypertension 04/27/2015  . Dyspnea 04/26/2015  . OSA (obstructive sleep apnea) 08/24/2014  . Streptococcal sore throat 06/04/2014  . Pharyngitis 06/02/2014  . Leukocytosis 06/02/2014  . Severe obesity (BMI >= 40) ([place]) 06/02/2014  . Pharyngitis, acute 06/02/2014  . Prediabetes   . Vitamin D deficiency     Past Surgical History:  Procedure Laterality Date  . COLONOSCOPY WITH PROPOFOL N/A 09/16/2015   Procedure: COLONOSCOPY WITH PROPOFOL;  Surgeon: Carol Ada, MD;  Location: [facility]COPY;  Service: Endoscopy;  Laterality: N/A;  . NO PAST SURGERIES          Home Medications    Prior to Admission medications   Medication Sig Start Date End Date Taking? Authorizing Provider  carvedilol (COREG) 3.125 MG tablet Take 3.125 mg by mouth 2 (two) times daily. 08/23/15  Yes [provider]  furosemide (LASIX) 40 MG tablet Take 40 mg by mouth daily.  08/23/15  Yes [provider]  gabapentin (NEURONTIN) 300 MG capsule Take 1 capsule (300 mg total) by mouth 3 (three) times daily. 12/14/17  Yes Patrecia Pour, NP  losartan (COZAAR) 25 MG tablet Take 1 tablet (25 mg total) by mouth daily. 12/15/17  Yes Patrecia Pour, NP  carbamazepine (TEGRETOL) 200 MG tablet Beginning on morning of 12/16/17: Take 600mg  PO QD X 1D,  then 400mg  QD X 1D, then 200mg  PO QD X 2D 12/30/17   Karren Newland A, PA-C    Family History Family History  Problem Relation Age of Onset  . Heart attack Father     Social History Social History   Tobacco Use  . Smoking status: Former Smoker    Packs/day: 1.00    Years: 32.00    Pack years: 32.00    Types: Cigarettes    Last attempt to quit: 11/18/2014    Years since quitting: 3.1  . Smokeless tobacco: Never Used  Substance Use Topics  . Alcohol use: Yes    Comment: a fifth a day sometimes a pint  . Drug use: No     Allergies   Patient has no  known allergies.   Review of Systems Review of Systems  Constitutional: Negative for appetite change, chills and fever.  HENT: Negative for congestion, ear pain and sore throat.   Respiratory: Negative for shortness of breath.   Cardiovascular: Negative for chest pain, palpitations and leg swelling.  Gastrointestinal: Negative for abdominal pain, diarrhea, nausea and vomiting.  Genitourinary: Negative for dysuria.  Musculoskeletal: Negative for back pain, myalgias, neck pain and neck stiffness.  Skin: Negative for rash.  Allergic/Immunologic: Negative for immunocompromised state.  Neurological: Positive for dizziness. Negative for tremors, seizures, weakness, numbness and headaches.  Psychiatric/Behavioral: Negative for agitation, confusion, hallucinations, self-injury and sleep disturbance. The patient is nervous/anxious.      Physical Exam Updated Vital Signs BP (!) 153/89 (BP Location: Right Arm)   Pulse (!) 103 Comment: after walking now sitting on bed  Temp (!) 97.3 F (36.3 C) (Oral)   Resp 17   Ht 6\' 1"  (1.854 m)   Wt (!) 154.2 kg   SpO2 94%   BMI 44.86 kg/m   Physical Exam  Constitutional: He appears well-developed.  HENT:  Head: Normocephalic.  Eyes: Conjunctivae are normal.  Neck: Neck supple.  Cardiovascular: Normal rate, regular rhythm, normal heart sounds and intact distal pulses. Exam reveals no gallop and no friction rub.  No murmur heard. Pulmonary/Chest: Effort normal and breath sounds normal. No stridor. No respiratory distress. He has no wheezes. He has no rales. He exhibits no tenderness.  Abdominal: Soft. Bowel sounds are normal. He exhibits no distension. There is no tenderness.  Musculoskeletal: He exhibits no tenderness.  Neurological: He is alert.  Alert and oriented x4.  Cranial nerves II through XII are grossly intact.  5 out of 5 strength against resistance of the bilateral upper and lower extremities.  Sensation is intact and equal throughout.   Ambulatory without difficulty.  Finger-to-nose is intact bilaterally.  Normal rapid alternating movements.  Normal heel-to-shin bilaterally.  Negative pronator drift.  Skin: Skin is warm and dry.  Psychiatric: His speech is normal and behavior is normal. Thought content normal. He is not actively hallucinating.  Nursing note and vitals reviewed.    ED Treatments / Results  Labs (all labs ordered are listed, but only abnormal results are displayed) Labs Reviewed  BASIC METABOLIC PANEL - Abnormal; Notable for the following components:      Result Value   CO2 21 (*)    Glucose, Bld 102 (*)    Calcium 8.5 (*)    All other components within normal limits  CBC - Abnormal; Notable for the following components:   RBC 4.21 (*)    All other components within normal limits    EKG EKG Interpretation  Date/Time:  Monday December 30 2017  05:58:41 EST Ventricular Rate:  90 PR Interval:    QRS Duration: 98 QT Interval:  352 QTC Calculation: 431 R Axis:     Text Interpretation:  Sinus rhythm Low voltage, precordial leads Probable anteroseptal infarct, old Baseline wander in lead(s) II No significant change since last tracing Confirmed by Isla Pence [phone]) on 12/30/2017 7:25:55 AM   Radiology No results found.  Procedures Procedures (including critical care time)  Medications Ordered in ED Medications  thiamine (VITAMIN B-1) tablet 100 mg (100 mg Oral Given 12/30/17 0700)  LORazepam (ATIVAN) tablet 1 mg (1 mg Oral Given 12/30/17 0701)  carbamazepine (TEGRETOL) tablet 200 mg (200 mg Oral Given 12/30/17 0701)  sodium chloride 0.9 % bolus 500 mL (0 mLs Intravenous Stopped 12/30/17 0928)  LORazepam (ATIVAN) injection 0.5 mg (0.5 mg Intravenous Given 12/30/17 0837)     Initial Impression / Assessment and Plan / ED Course  I have reviewed the triage vital signs and the nursing notes.  Pertinent labs & imaging results that were available during my care of the patient were reviewed  by me and considered in my medical decision making (see chart for details).  52 year old male with a history of alcohol use disorder, hypertension, hyperlipidemia, anxiety, OSA, and prediabetes presenting with dizziness, onset at 4 AM.  He is also feeling more anxious.  No other associated symptoms.  Patient's medical record has been reviewed.  He has been seen and evaluated for the same complaints in the ER several times over the last 6 months.  Symptoms resolved after he is given a dose of Ativan and he has previously been started on Tegretol.  Oral Ativan and first dose of Tegretol has been ordered in the ED as the patient does request assisting with abstinence from alcohol.  The patient was seen and independently evaluated by Dr. Zenia Resides, attending physician.  Clinical Course as of Jan 01 1639  Mon Dec 30, 2017  0801 Patient recheck. Dizziness is mildly improved after oral ativan. IVF and IV ativan have been ordered, but not initiated. He also endorses new paresthesias in his right hand, in the area his IV is placed. 5/5 motor strength and equal sensation to the bilateral upper extremities on exam.  Will also order IV fluids and a small dose of IV Ativan as the patient reports minimal fluid intake other than EtOH over the last 24 hours.   [MM]    Clinical Course User Index [MM] Carlethia Mesquita A, PA-C   Following IV fluids and Ativan administration, the patient's symptoms have resolved.  He was ambulated in the ED without difficulty.  Will discharge with a course of Tegretol.  The patient was counseled on abstinence from EtOH.  He was also given inpatient outpatient substance use disorder resources in the community.  Low suspicion for alcohol intoxication, complicated alcohol withdrawal, inner ear problem, migraine, CVA, CAD, or other etiology at this time.  Strict return precautions given.  He is hemodynamically stable and in no acute distress.  He is safe for discharge home with outpatient follow-up at  this time.  Final Clinical Impressions(s) / ED Diagnoses   Final diagnoses:  Alcohol use disorder, severe, dependence ([place])  Dizziness    ED Discharge Orders         Ordered    carbamazepine (TEGRETOL) 200 MG tablet     12/30/17 0939           Joline Maxcy A, PA-C 12/31/17 1640    Isla Pence, MD 01/02/18 [phone]

## 2017-12-30 NOTE — Discharge Instructions (Addendum)
Thank you for allowing me to care for you today in the Emergency Department.   Make sure to drink plenty of water, at least 64 ounces of water daily.  This should help with your dizziness.  Take 600 mg of Tegretol by mouth 4 times daily for the first day.  Your first dose was given in the ER.  Then, take 400 mg 4 times daily for 1 day, then take 200 mg once daily for 2 days.  This is to help prevent you from going into alcohol withdrawal.  If you stopped drinking alcohol would probably significantly help with your dizziness.  If your anxiety continues to be a problem, you can follow-up with [facility].  I have attached a resource guide for both inpatient and outpatient rehabilitation centers.  Return to the emergency department if you develop dizziness with a severe headache, new numbness or weakness, slurred speech, seizures, changes in your vision, or other new, concerning symptoms.

## 2017-12-30 NOTE — ED Notes (Signed)
Pt reports that he drinks one pint on liquor a day and his last drink was around 4pm before going to work, denies SI/HI/AVH

## 2018-01-13 ENCOUNTER — Emergency Department (HOSPITAL_COMMUNITY)
Admission: EM | Admit: 2018-01-13 | Discharge: 2018-01-13 | Disposition: A | Discharge status: Home / Self Care | Payer: Self-pay | Attending: Emergency Medicine | Admitting: Emergency Medicine

## 2018-01-13 ENCOUNTER — Emergency Department (HOSPITAL_COMMUNITY): Payer: Self-pay

## 2018-01-13 ENCOUNTER — Encounter (HOSPITAL_COMMUNITY): Payer: Self-pay | Admitting: *Deleted

## 2018-01-13 ENCOUNTER — Other Ambulatory Visit: Payer: Self-pay

## 2018-01-13 DIAGNOSIS — E876 Hypokalemia: Secondary | ICD-10-CM

## 2018-01-13 DIAGNOSIS — I10 Essential (primary) hypertension: Secondary | ICD-10-CM | POA: Insufficient documentation

## 2018-01-13 DIAGNOSIS — Z87891 Personal history of nicotine dependence: Secondary | ICD-10-CM | POA: Insufficient documentation

## 2018-01-13 DIAGNOSIS — F10230 Alcohol dependence with withdrawal, uncomplicated: Secondary | ICD-10-CM | POA: Insufficient documentation

## 2018-01-13 DIAGNOSIS — Y900 Blood alcohol level of less than 20 mg/100 ml: Secondary | ICD-10-CM | POA: Insufficient documentation

## 2018-01-13 DIAGNOSIS — Z79899 Other long term (current) drug therapy: Secondary | ICD-10-CM | POA: Insufficient documentation

## 2018-01-13 DIAGNOSIS — F10930 Alcohol use, unspecified with withdrawal, uncomplicated: Secondary | ICD-10-CM

## 2018-01-13 HISTORY — DX: Dizziness and giddiness: R42

## 2018-01-13 HISTORY — DX: Anxiety disorder, unspecified: F41.9

## 2018-01-13 HISTORY — DX: Alcohol abuse, uncomplicated: F10.10

## 2018-01-13 LAB — COMPREHENSIVE METABOLIC PANEL
ALK PHOS: 81 U/L (ref 38–126)
ALT: 22 U/L (ref 0–44)
ANION GAP: 17 — AB (ref 5–15)
AST: 31 U/L (ref 15–41)
Albumin: 4 g/dL (ref 3.5–5.0)
BILIRUBIN TOTAL: 1.3 mg/dL — AB (ref 0.3–1.2)
BUN: 8 mg/dL (ref 6–20)
CHLORIDE: 96 mmol/L — AB (ref 98–111)
CO2: 22 mmol/L (ref 22–32)
Calcium: 8.8 mg/dL — ABNORMAL LOW (ref 8.9–10.3)
Creatinine, Ser: 1.02 mg/dL (ref 0.61–1.24)
GFR calc Af Amer: >60 mL/min (ref >60)
GFR calc non Af Amer: >60 mL/min (ref >60)
GLUCOSE: 105 mg/dL — AB (ref 70–99)
Potassium: 3.3 mmol/L — ABNORMAL LOW (ref 3.5–5.1)
Sodium: 135 mmol/L (ref 135–145)
Total Protein: 7.9 g/dL (ref 6.5–8.1)

## 2018-01-13 LAB — TROPONIN I: Troponin I: <0.03 ng/mL (ref <0.03)

## 2018-01-13 LAB — RAPID URINE DRUG SCREEN, HOSP PERFORMED
Amphetamines: NOT DETECTED
Barbiturates: NOT DETECTED
Benzodiazepines: NOT DETECTED
COCAINE: NOT DETECTED
Opiates: NOT DETECTED
Tetrahydrocannabinol: NOT DETECTED

## 2018-01-13 LAB — CBC WITH DIFFERENTIAL/PLATELET
ABS IMMATURE GRANULOCYTES: 0.03 10*3/uL (ref 0.00–0.07)
BASOS ABS: 0.1 10*3/uL (ref 0.0–0.1)
Basophils Relative: 1 %
EOS ABS: 0 10*3/uL (ref 0.0–0.5)
Eosinophils Relative: 0 %
HEMATOCRIT: 42 % (ref 39.0–52.0)
Hemoglobin: 13.9 g/dL (ref 13.0–17.0)
IMMATURE GRANULOCYTES: 0 %
LYMPHS ABS: 1.9 10*3/uL (ref 0.7–4.0)
Lymphocytes Relative: 19 %
MCH: 31.1 pg (ref 26.0–34.0)
MCHC: 33.1 g/dL (ref 30.0–36.0)
MCV: 94 fL (ref 80.0–100.0)
Monocytes Absolute: 0.9 10*3/uL (ref 0.1–1.0)
Monocytes Relative: 9 %
NEUTROS ABS: 7.3 10*3/uL (ref 1.7–7.7)
NEUTROS PCT: 71 %
NRBC: 0 % (ref 0.0–0.2)
PLATELETS: 255 10*3/uL (ref 150–400)
RBC: 4.47 MIL/uL (ref 4.22–5.81)
RDW: 13.8 % (ref 11.5–15.5)
WBC: 10.2 10*3/uL (ref 4.0–10.5)

## 2018-01-13 LAB — URINALYSIS, ROUTINE W REFLEX MICROSCOPIC
Bilirubin Urine: NEGATIVE
Glucose, UA: NEGATIVE mg/dL
Hgb urine dipstick: NEGATIVE
KETONES UR: NEGATIVE mg/dL
Leukocytes, UA: NEGATIVE
NITRITE: NEGATIVE
PROTEIN: NEGATIVE mg/dL
Specific Gravity, Urine: 1.008 (ref 1.005–1.030)
pH: 7 (ref 5.0–8.0)

## 2018-01-13 LAB — ETHANOL

## 2018-01-13 LAB — MAGNESIUM: Magnesium: 1.7 mg/dL (ref 1.7–2.4)

## 2018-01-13 MED ORDER — CHLORDIAZEPOXIDE HCL 25 MG PO CAPS: ORAL_CAPSULE | ORAL | 0 refills | Status: DC

## 2018-01-13 MED ORDER — SODIUM CHLORIDE 0.9 % IV SOLN: INTRAVENOUS | Status: DC

## 2018-01-13 MED ORDER — SODIUM CHLORIDE 0.9 % IV BOLUS
500.0000 mL | Freq: Once | INTRAVENOUS | Status: AC
  Administered 2018-01-13: 500 mL via INTRAVENOUS

## 2018-01-13 MED ORDER — POTASSIUM CHLORIDE CRYS ER 20 MEQ PO TBCR
40.0000 meq | EXTENDED_RELEASE_TABLET | Freq: Once | ORAL | Status: AC
  Administered 2018-01-13: 40 meq via ORAL
  Filled 2018-01-13: qty 2

## 2018-01-13 MED ORDER — THIAMINE HCL 100 MG/ML IJ SOLN: 100.0000 mg | Freq: Every day | INTRAMUSCULAR | Status: DC

## 2018-01-13 MED ORDER — LORAZEPAM 1 MG PO TABS: 0.0000 mg | ORAL_TABLET | Freq: Four times a day (QID) | ORAL | Status: DC

## 2018-01-13 MED ORDER — VITAMIN B-1 100 MG PO TABS: 100.0000 mg | ORAL_TABLET | Freq: Every day | ORAL | Status: DC

## 2018-01-13 MED ORDER — LORAZEPAM 2 MG/ML IJ SOLN
0.0000 mg | Freq: Four times a day (QID) | INTRAMUSCULAR | Status: DC
  Administered 2018-01-13: 1 mg via INTRAVENOUS
  Filled 2018-01-13: qty 1

## 2018-01-13 MED ORDER — LORAZEPAM 2 MG/ML IJ SOLN: 0.0000 mg | Freq: Two times a day (BID) | INTRAMUSCULAR | Status: DC

## 2018-01-13 MED ORDER — LORAZEPAM 1 MG PO TABS: 0.0000 mg | ORAL_TABLET | Freq: Two times a day (BID) | ORAL | Status: DC

## 2018-01-13 NOTE — ED Provider Notes (Signed)
[facility] EMERGENCY DEPARTMENT Provider Note   CSN: [id number] Arrival date & time: 01/13/18  9211     History   Chief Complaint Chief Complaint  Patient presents with  . Dizziness    HPI Shaun Stokes is a 52 y.o. male.  HPI  Pt was seen at [place].  Per pt, c/o gradual onset and persistence of constant "dizziness" that began approximately 8 hours ago. Pt describes the dizziness as "lightheadedness." Pt states his symptoms began before he had to go to work. LD etoh was yesterday evening; last meal yesterday afternoon. Pt endorses feeling "shaky" when he does not drink etoh for a period of time, and currently feels "shaky." Denies CP/palpitations, no SOB/cough, no abd pain, no N/V/D, no vertigo symptoms, no focal motor weakness, no tingling/numbness in extremities.  The symptoms have been associated with no other complaints. The patient has a significant history of similar symptoms previously, recently being evaluated for this complaint and multiple prior evals for same. Pt has not f/u with is PMD as previously instructed.    Past Medical History:  Diagnosis Date  . Alcohol abuse   . Anxiety   . Chest pain   . Dizziness    r/t anxiety, alcohol withdrawal  . Hyperlipemia   . Hypertension   . Insomnia   . Obesity   . OSA (obstructive sleep apnea) 08/24/2014  . Prediabetes   . Vitamin D deficiency     Patient Active Problem List   Diagnosis Date Noted  . Alcohol abuse with alcohol-induced mood disorder ([place]) 12/14/2017  . Essential hypertension 04/27/2015  . Dyspnea 04/26/2015  . OSA (obstructive sleep apnea) 08/24/2014  . Streptococcal sore throat 06/04/2014  . Pharyngitis 06/02/2014  . Leukocytosis 06/02/2014  . Severe obesity (BMI >= 40) ([place]) 06/02/2014  . Pharyngitis, acute 06/02/2014  . Prediabetes   . Vitamin D deficiency     Past Surgical History:  Procedure Laterality Date  . COLONOSCOPY WITH PROPOFOL N/A 09/16/2015   Procedure: COLONOSCOPY  WITH PROPOFOL;  Surgeon: Carol Ada, MD;  Location: [facility];  Service: Endoscopy;  Laterality: N/A;  . NO PAST SURGERIES          Home Medications    Prior to Admission medications   Medication Sig Start Date End Date Taking? Authorizing Provider  carbamazepine (TEGRETOL) 200 MG tablet Beginning on morning of 12/16/17: Take 600mg  PO QD X 1D, then 400mg  QD X 1D, then 200mg  PO QD X 2D 12/30/17   McDonald, Mia A, PA-C  carvedilol (COREG) 3.125 MG tablet Take 3.125 mg by mouth 2 (two) times daily. 08/23/15   [provider]  furosemide (LASIX) 40 MG tablet Take 40 mg by mouth daily.  08/23/15   [provider]  gabapentin (NEURONTIN) 300 MG capsule Take 1 capsule (300 mg total) by mouth 3 (three) times daily. 12/14/17   Patrecia Pour, NP  losartan (COZAAR) 25 MG tablet Take 1 tablet (25 mg total) by mouth daily. 12/15/17   Patrecia Pour, NP    Family History Family History  Problem Relation Age of Onset  . Heart attack Father     Social History Social History   Tobacco Use  . Smoking status: Former Smoker    Packs/day: 1.00    Years: 32.00    Pack years: 32.00    Types: Cigarettes    Last attempt to quit: 11/18/2014    Years since quitting: 3.1  . Smokeless tobacco: Never Used  Substance Use  Topics  . Alcohol use: Yes    Comment: a fifth a day sometimes a pint  . Drug use: No     Allergies   Patient has no known allergies.   Review of Systems Review of Systems ROS: Statement: All systems negative except as marked or noted in the HPI; Constitutional: Negative for fever and chills. ; ; Eyes: Negative for eye pain, redness and discharge. ; ; ENMT: Negative for ear pain, hoarseness, nasal congestion, sinus pressure and sore throat. ; ; Cardiovascular: Negative for chest pain, palpitations, diaphoresis, dyspnea and peripheral edema. ; ; Respiratory: Negative for cough, wheezing and stridor. ; ; Gastrointestinal: Negative for nausea, vomiting, diarrhea,  abdominal pain, blood in stool, hematemesis, jaundice and rectal bleeding. . ; ; Genitourinary: Negative for dysuria, flank pain and hematuria. ; ; Musculoskeletal: Negative for back pain and neck pain. Negative for swelling and trauma.; ; Skin: Negative for pruritus, rash, abrasions, blisters, bruising and skin lesion.; ; Neuro: +lightheadedness. Negative for headache and neck stiffness. Negative for weakness, altered level of consciousness, altered mental status, extremity weakness, paresthesias, involuntary movement, seizure and syncope.       Physical Exam Updated Vital Signs BP (!) 154/94   Pulse (!) 112   Temp 98.1 F (36.7 C) (Oral)   Resp (!) 26   Ht 6\' 1"  (1.854 m)   Wt (!) 154 kg   SpO2 98%   BMI 44.79 kg/m    Patient Vitals for the past 24 hrs:  BP Temp Temp src Pulse Resp SpO2 Height Weight  01/13/18 0915 (!) 147/79 - - 100 (!) 22 91 % - -  01/13/18 0910 (!) 149/89 98.2 F (36.8 C) Oral (!) 107 18 97 % - -  01/13/18 0900 (!) 142/83 - - (!) 101 (!) 21 97 % - -  01/13/18 0815 (!) 142/84 - - (!) 108 20 91 % - -  01/13/18 0800 (!) 144/81 - - (!) 105 20 (!) 89 % - -  01/13/18 0717 (!) 154/94 - - (!) 112 - - - -  01/13/18 0716 - 98.1 F (36.7 C) Oral (!) 112 (!) 26 98 % - -  01/13/18 0659 - - - - - - 6\' 1"  (1.854 m) (!) 154 kg  01/13/18 0658 (!) 154/94 98.1 F (36.7 C) Oral (!) 120 18 98 % - -    07:47 Orthostatic Vital Signs RW  Orthostatic Lying   BP- Lying: 153/83   Pulse- Lying: 104       Orthostatic Sitting  BP- Sitting: 144/90   Pulse- Sitting: 113       Orthostatic Standing at 0 minutes  BP- Standing at 0 minutes: 136/91Abnormal    Pulse- Standing at 0 minutes: 124      Physical Exam 0720: Physical examination:  Nursing notes reviewed; Vital signs and O2 SAT reviewed;  Constitutional: Well developed, Well nourished, Well hydrated, In no acute distress; Head:  Normocephalic, atraumatic; Eyes: EOMI, PERRL, No scleral icterus; ENMT: Mouth and pharynx  normal, Mucous membranes moist; Neck: Supple, Full range of motion, No lymphadenopathy; Cardiovascular: Regular rate and rhythm, No gallop; Respiratory: Breath sounds clear & equal bilaterally, No wheezes.  Speaking full sentences with ease, Normal respiratory effort/excursion; Chest: Nontender, Movement normal; Abdomen: Soft, Nontender, Nondistended, Normal bowel sounds; Genitourinary: No CVA tenderness; Extremities: Peripheral pulses normal, No tenderness, No edema, No calf edema or asymmetry. +faint tremor bilat UE's..; Neuro: AA&Ox3, Major CN grossly intact. No facial droop. Speech clear. Grips equal. Strength 5/5  equal bilat UE's and LE's.  DTR 2/4 equal bilat UE's and LE's.  No gross sensory deficits.  Normal cerebellar testing bilat UE's (finger-nose) and LE's (heel-shin)..; Skin: Color normal, Warm, Dry.   ED Treatments / Results  Labs (all labs ordered are listed, but only abnormal results are displayed)   EKG EKG Interpretation  Date/Time:  Monday January 13 2018 07:17:40 EST Ventricular Rate:  109 PR Interval:    QRS Duration: 84 QT Interval:  333 QTC Calculation: 449 R Axis:   19 Text Interpretation:  Sinus tachycardia Probable left atrial enlargement Anterior infarct, old When compared with ECG of 12/30/2017 Rate faster Otherwise no significant change Confirmed by Francine Graven [phone]) on 01/13/2018 7:24:31 AM   Radiology   Procedures Procedures (including critical care time)  Medications Ordered in ED Medications  LORazepam (ATIVAN) injection 0-4 mg (has no administration in time range)    Or  LORazepam (ATIVAN) tablet 0-4 mg (has no administration in time range)  LORazepam (ATIVAN) injection 0-4 mg (has no administration in time range)    Or  LORazepam (ATIVAN) tablet 0-4 mg (has no administration in time range)  thiamine (VITAMIN B-1) tablet 100 mg (has no administration in time range)    Or  thiamine (B-1) injection 100 mg (has no administration in time  range)  sodium chloride 0.9 % bolus 500 mL (has no administration in time range)  0.9 %  sodium chloride infusion (has no administration in time range)     Initial Impression / Assessment and Plan / ED Course  I have reviewed the triage vital signs and the nursing notes.  Pertinent labs & imaging results that were available during my care of the patient were reviewed by me and considered in my medical decision making (see chart for details).  MDM Reviewed: previous chart, nursing note and vitals Reviewed previous: labs, ECG, MRI, x-ray and CT scan Interpretation: labs, ECG and CT scan    Results for orders placed or performed during the hospital encounter of 01/13/18  Comprehensive metabolic panel  Result Value Ref Range   Sodium 135 135 - 145 mmol/L   Potassium 3.3 (L) 3.5 - 5.1 mmol/L   Chloride 96 (L) 98 - 111 mmol/L   CO2 22 22 - 32 mmol/L   Glucose, Bld 105 (H) 70 - 99 mg/dL   BUN 8 6 - 20 mg/dL   Creatinine, Ser 1.02 0.61 - 1.24 mg/dL   Calcium 8.8 (L) 8.9 - 10.3 mg/dL   Total Protein 7.9 6.5 - 8.1 g/dL   Albumin 4.0 3.5 - 5.0 g/dL   AST 31 15 - 41 U/L   ALT 22 0 - 44 U/L   Alkaline Phosphatase 81 38 - 126 U/L   Total Bilirubin 1.3 (H) 0.3 - 1.2 mg/dL   GFR calc non Af Amer >60 >60 mL/min   GFR calc Af Amer >60 >60 mL/min   Anion gap 17 (H) 5 - 15  Ethanol  Result Value Ref Range   Alcohol, Ethyl (B) <10 <10 mg/dL  Troponin I - Once  Result Value Ref Range   Troponin I <0.03 <0.03 ng/mL  CBC with Differential  Result Value Ref Range   WBC 10.2 4.0 - 10.5 K/uL   RBC 4.47 4.22 - 5.81 MIL/uL   Hemoglobin 13.9 13.0 - 17.0 g/dL   HCT 42.0 39.0 - 52.0 %   MCV 94.0 80.0 - 100.0 fL   MCH 31.1 26.0 - 34.0 pg   MCHC 33.1 30.0 -  36.0 g/dL   RDW 13.8 11.5 - 15.5 %   Platelets 255 150 - 400 K/uL   nRBC 0.0 0.0 - 0.2 %   Neutrophils Relative % 71 %   Neutro Abs 7.3 1.7 - 7.7 K/uL   Lymphocytes Relative 19 %   Lymphs Abs 1.9 0.7 - 4.0 K/uL   Monocytes Relative 9 %     Monocytes Absolute 0.9 0.1 - 1.0 K/uL   Eosinophils Relative 0 %   Eosinophils Absolute 0.0 0.0 - 0.5 K/uL   Basophils Relative 1 %   Basophils Absolute 0.1 0.0 - 0.1 K/uL   Immature Granulocytes 0 %   Abs Immature Granulocytes 0.03 0.00 - 0.07 K/uL  Urinalysis, Routine w reflex microscopic  Result Value Ref Range   Color, Urine STRAW (A) YELLOW   APPearance CLEAR CLEAR   Specific Gravity, Urine 1.008 1.005 - 1.030   pH 7.0 5.0 - 8.0   Glucose, UA NEGATIVE NEGATIVE mg/dL   Hgb urine dipstick NEGATIVE NEGATIVE   Bilirubin Urine NEGATIVE NEGATIVE   Ketones, ur NEGATIVE NEGATIVE mg/dL   Protein, ur NEGATIVE NEGATIVE mg/dL   Nitrite NEGATIVE NEGATIVE   Leukocytes, UA NEGATIVE NEGATIVE  Urine rapid drug screen (hosp performed)  Result Value Ref Range   Opiates NONE DETECTED NONE DETECTED   Cocaine NONE DETECTED NONE DETECTED   Benzodiazepines NONE DETECTED NONE DETECTED   Amphetamines NONE DETECTED NONE DETECTED   Tetrahydrocannabinol NONE DETECTED NONE DETECTED   Barbiturates NONE DETECTED NONE DETECTED  Magnesium  Result Value Ref Range   Magnesium 1.7 1.7 - 2.4 mg/dL   Ct Head Wo Contrast Result Date: 01/13/2018 CLINICAL DATA:  Alcohol withdrawal.  Lightheadedness. EXAM: CT HEAD WITHOUT CONTRAST TECHNIQUE: Contiguous axial images were obtained from the base of the skull through the vertex without intravenous contrast. COMPARISON:  09/24/2017; brain [id number] FINDINGS: Brain: Gray-white differentiation is maintained. No CT evidence of acute large territory infarct. No intraparenchymal or extra-axial mass or hemorrhage. 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 Unchanged size and configuration of the ventricles and basilar cisterns. No midline shift. Vascular: No hyperdense vessel or unexpected calcification. Skull: No displaced calvarial fracture. Sinuses/Orbits: Limited visualization the paranasal sinuses and mastoid air cells is normal. No air-fluid levels. Other:  Regional soft tissues appear normal. IMPRESSION: Negative noncontrast head CT. Electronically Signed   By: Sandi Mariscal M.D.   On: 01/13/2018 08:52     0950:  Pt has slept while in the ED. IVF and ativan given for etoh withdrawal with improving CIWA and VS. Potassium repleted PO. Pt has tol PO well while in the ED without N/V. HR 90's, SBP 140's on my re-exam. Pt states he feels better and is ready to go home now. Pt confirms he was at [facility] last month for etoh detox.  I offered outpt detox resources and librium; pt accepted. No clear indication for TTS consult or medical admission at this time. Dx and testing d/w pt.  Questions answered.  Verb understanding, agreeable to d/c home with outpt f/u.      Final Clinical Impressions(s) / ED Diagnoses   Final diagnoses:  None    ED Discharge Orders    None       Francine Graven, DO 01/17/18 3300

## 2018-01-13 NOTE — ED Notes (Signed)
Pt states has had lightheaded sensation for the past 8 hours. Last drink yesterday. Neuro intact. Pt reports shaky sensation without alcohol.

## 2018-01-13 NOTE — ED Notes (Signed)
Pt given [place] sandwich and coke for PO challenge

## 2018-01-13 NOTE — ED Triage Notes (Signed)
C/o dizziness with congestion front of his head onset 8 hours ago , drove self to ED

## 2018-01-13 NOTE — ED Notes (Signed)
Patient transported to CT 

## 2018-01-13 NOTE — Discharge Instructions (Signed)
Substance Abuse Treatment Programs ° °Intensive Outpatient Programs °[facility]     °[address]      °[place], [place]                   °[phone]      ° °[facility] °[address] °[place], [place] °[phone] ° °[facility]     °(Inpatient and outpatient)     °[address]           °[phone]   ° °[facility] °[phone] (Suboxone and Methadone) ° °[address]      °[place], [place] [address]      °[phone]      ° °[address] °[place], [place] °[phone] ° °[facility] (Outpatient/Inpatient, [facility])    °(insurance only) [phone]      °       °[facility] (Groups & Residential) °[place], [place] °[phone] ° °   °[facility]     °[address]     °[place], [place]      °[phone]      ° °[facility] (for caregivers and family) °[address] °[place], [place] °336-299-4655 ° ° ° ° ° °Residential Treatment Programs °[facility]      °[address], [place], [place] [address]  °[phone]      ° °[facility] °[address] [place], [place] [address] °[phone] ° °[facility]        °[phone]      ° °[facility] °[phone] ° °[facility]             °[address]                                         °[place], [place]                                                °[phone] or [phone]                              ° °[facility] [place] °[address] °[place], [address] °[phone] ° °[facility]    °[address]      °[place], [place]     °[phone]      ° °[facility] °[address] °[place], [place] °[phone] ° °[facility]   °[address]     °[place], [place] [address]     °[phone]      °Admissions: 8am-3pm M-F ° °[facility] °[address] °[place],  [place] °[phone] ° °[facility]: Residential Program (90 Days)   °[place], [place]      °[phone] or [phone]    ° °[facility] °[place], [place] °(Walk in Hours over the weekend or by referral) ° °[facility] °[address], [place],  [address] °([phone]) [phone] ° °Crisis Mobile: [facility]:  1-877-626-1772 (for crisis response 24 hours a day) °[facility] Hotline:      1-800-256-2452 °Outpatient Psychiatry and Counseling ° °[facility]: Mobile Crisis   Management 24 hours:  1-579-867-5796  [facility] of the [employer] sliding scale fee and walk in schedule: M-F 8am-12pm/1pm-3pm [address]  [place], [place] [address] [place], [place] [address] [phone]  [facility] ([facility])- new patient walk-in appointments available Monday - Friday 8am -3pm.          [address] [place], [place] [address] [phone] or crisis line- [facility]/ Intensive Outpatient Therapy Program [place], [place] [address] [place]      [phone] [place], [place] [address]                 [place]   [facility] [phone] [place], [place] [address]   [facility]          [address] Johnette Abraham  [place], [place] [address]       [phone]  [facility] [address] [place], [place] [address] [phone]  [facility]    [address] [place], [place] [address]     [place], [place] Joycelyn Man     [place] [address]     [phone]       [facility] [place] [address]  [facility]     [address], [place], [place], [place] [address] [place]     [address]     [place], [place] [address]     [phone]       [facility] [address] [place], [place] [address] [phone] Resources for Temporary Residential Assistance/Crisis [facility]) M-F 8am-3pm   [address] [place], [place] [address]   [phone] Services include: laundry, barbering, support groups, case management, phone  & computer access, showers, AA/NA mtgs, mental health/substance abuse nurse, job skills class, disability information, VA assistance, spiritual classes, etc.   [facility]   [address], [place]     [place]              [employer] (women and children)       [place]. [place], [place] [address] [phone] [email or web address] for application and process Application Required  [facility]   [address]    [place] [address]     [phone]                    [place],  [address] [phone](OILNZVJK application appt.) Application Required  [facility] (women only)    [address]     [place],  [address]     [phone]  Intake starts 6pm daily Need valid ID, SSC, & Police report [employer] [address] [place], [place] [phone] Application Required  [employer] (men only)     [place].      [place], [place]       [facility] (Pregnant women only) [address] [place], [place]  [facility]      [place]      [place], [place] [address]     [phone]             [facility] [address] [place], [place] 90 day commitment/SA/Application process  [facility](men only)     [address]     [place], [place]       Check-in at [facility] [address] [place], [place] [address] [phone] Men/Women/Women and Children must be there by 7 pm  [place], [place]                  Take the prescription as directed.  Call the outpatient resources given to you today if you are interested in alcohol detox programs. Call your regular medical doctor today to schedule a follow up appointment within the next 3 days.  Return to the Emergency Department immediately sooner if worsening.

## 2019-02-09 ENCOUNTER — Emergency Department (HOSPITAL_COMMUNITY)
Admission: EM | Admit: 2019-02-09 | Discharge: 2019-02-09 | Disposition: A | Discharge status: Home / Self Care | Payer: Managed Care, Other (non HMO) | Attending: Emergency Medicine | Admitting: Emergency Medicine

## 2019-02-09 ENCOUNTER — Emergency Department (HOSPITAL_COMMUNITY): Payer: Managed Care, Other (non HMO)

## 2019-02-09 ENCOUNTER — Other Ambulatory Visit: Payer: Self-pay

## 2019-02-09 ENCOUNTER — Encounter (HOSPITAL_COMMUNITY): Payer: Self-pay | Admitting: Emergency Medicine

## 2019-02-09 DIAGNOSIS — U071 COVID-19: Secondary | ICD-10-CM | POA: Diagnosis not present

## 2019-02-09 DIAGNOSIS — I110 Hypertensive heart disease with heart failure: Secondary | ICD-10-CM | POA: Diagnosis not present

## 2019-02-09 DIAGNOSIS — J069 Acute upper respiratory infection, unspecified: Secondary | ICD-10-CM

## 2019-02-09 DIAGNOSIS — R509 Fever, unspecified: Secondary | ICD-10-CM | POA: Insufficient documentation

## 2019-02-09 DIAGNOSIS — I509 Heart failure, unspecified: Secondary | ICD-10-CM | POA: Insufficient documentation

## 2019-02-09 DIAGNOSIS — Z87891 Personal history of nicotine dependence: Secondary | ICD-10-CM | POA: Insufficient documentation

## 2019-02-09 DIAGNOSIS — Z7982 Long term (current) use of aspirin: Secondary | ICD-10-CM | POA: Insufficient documentation

## 2019-02-09 DIAGNOSIS — Z79899 Other long term (current) drug therapy: Secondary | ICD-10-CM | POA: Insufficient documentation

## 2019-02-09 DIAGNOSIS — R05 Cough: Secondary | ICD-10-CM | POA: Diagnosis present

## 2019-02-09 DIAGNOSIS — Z20822 Contact with and (suspected) exposure to covid-19: Secondary | ICD-10-CM

## 2019-02-09 MED ORDER — ACETAMINOPHEN 500 MG PO TABS
1000.0000 mg | ORAL_TABLET | Freq: Once | ORAL | Status: AC
  Administered 2019-02-09: 1000 mg via ORAL
  Filled 2019-02-09: qty 2

## 2019-02-09 NOTE — ED Notes (Signed)
Discharge instructions discussed with pt. Pt verbalized understanding with no questions at this time. Pt to quarantine while waiting for COVID results

## 2019-02-09 NOTE — ED Triage Notes (Signed)
Pt here from home with c/o covid like symptoms , pt states that he may have been around some family at thanksgiving with covids type symptoms ,low grade temp according to pt and generally not feeling well

## 2019-02-09 NOTE — ED Provider Notes (Signed)
[facility] EMERGENCY DEPARTMENT Provider Note   CSN: [id number] Arrival date & time: 02/09/19  1453     History No chief complaint on file.   Shaun Stokes is a 53 y.o. male.  HPI  Patient is a 53 year old male with a history of obesity, hypertension, hyperlipidemia, OSA, heart failure  Patient presents today with complaints of cough, fatigue, fever, body aches, shortness of breath for the past 3 days.  Patient states he is in no pain and has no chest pain, diaphoresis, nausea, vomiting, abdominal pain, headaches or dizziness.  States he feels otherwise well.  He states he works in [employer] with clients on daily basis.  States that he is taking his medications as prescribed including his Lasix.  Denies any paroxysmal nocturnal dyspnea, orthopnea, denies any increased swelling in his legs, increased weight gain.   Patient states his cough is productive of clear to yellow phlegm.  Denies any congestion or postnasal drainage.  States he is taking all his other medications as prescribed.     Past Medical History:  Diagnosis Date  . Alcohol abuse   . Anxiety   . Chest pain   . Dizziness    r/t anxiety, alcohol withdrawal  . Hyperlipemia   . Hypertension   . Insomnia   . Obesity   . OSA (obstructive sleep apnea) 08/24/2014  . Prediabetes   . Vitamin D deficiency     Patient Active Problem List   Diagnosis Date Noted  . Alcohol abuse with alcohol-induced mood disorder ([place]) 12/14/2017  . Essential hypertension 04/27/2015  . Dyspnea 04/26/2015  . OSA (obstructive sleep apnea) 08/24/2014  . Streptococcal sore throat 06/04/2014  . Pharyngitis 06/02/2014  . Leukocytosis 06/02/2014  . Severe obesity (BMI >= 40) ([place]) 06/02/2014  . Pharyngitis, acute 06/02/2014  . Prediabetes   . Vitamin D deficiency     Past Surgical History:  Procedure Laterality Date  . COLONOSCOPY WITH PROPOFOL N/A 09/16/2015   Procedure: COLONOSCOPY WITH PROPOFOL;  Surgeon:  Carol Ada, MD;  Location: [facility] END[facility]COPY;  Service: Endoscopy;  Laterality: N/A;  . NO PAST SURGERIES         Family History  Problem Relation Age of Onset  . Heart attack Father     Social History   Tobacco Use  . Smoking status: Former Smoker    Packs/day: 1.00    Years: 32.00    Pack years: 32.00    Types: Cigarettes    Quit date: 11/18/2014    Years since quitting: 4.2  . Smokeless tobacco: Never Used  Substance Use Topics  . Alcohol use: Yes    Comment: a fifth a day sometimes a pint  . Drug use: No    Home Medications Prior to Admission medications   Medication Sig Start Date End Date Taking? Authorizing Provider  aspirin EC 81 MG tablet Take 81 mg by mouth daily.    [provider]  carbamazepine (TEGRETOL) 200 MG tablet Beginning on morning of 12/16/17: Take 600mg  PO QD X 1D, then 400mg  QD X 1D, then 200mg  PO QD X 2D Patient taking differently: Take 200 mg by mouth daily.  12/30/17   McDonald, Mia A, PA-C  carvedilol (COREG) 3.125 MG tablet Take 3.125 mg by mouth 2 (two) times daily. 08/23/15   [provider]  chlordiazePOXIDE (LIBRIUM) 25 MG capsule 50mg  PO TID x 1D, then 25-50mg  PO BID X 1D, then 25-50mg  PO QD X 1D 01/13/18   Francine Graven,  DO  folic acid (FOLVITE) 1 MG tablet Take 1 mg by mouth daily.    [provider]  furosemide (LASIX) 40 MG tablet Take 40 mg by mouth daily.  08/23/15   [provider]  gabapentin (NEURONTIN) 300 MG capsule Take 1 capsule (300 mg total) by mouth 3 (three) times daily. 12/14/17   Patrecia Pour, NP  losartan (COZAAR) 25 MG tablet Take 1 tablet (25 mg total) by mouth daily. 12/15/17   Patrecia Pour, NP    Allergies    Patient has no known allergies.  Review of Systems   Review of Systems  Constitutional: Positive for chills, fatigue and fever. Negative for diaphoresis and unexpected weight change.  HENT: Negative for congestion and ear pain.   Eyes: Negative for pain.  Respiratory:  Positive for cough and shortness of breath. Negative for chest tightness, wheezing and stridor.   Cardiovascular: Positive for leg swelling (Chronic). Negative for chest pain and palpitations.  Gastrointestinal: Negative for abdominal pain, diarrhea, nausea and vomiting.  Genitourinary: Negative for dysuria.  Musculoskeletal: Negative for myalgias.  Skin: Negative for rash.  Neurological: Negative for dizziness and headaches.    Physical Exam Updated Vital Signs BP 118/77   Pulse 100   Temp (!) 101.8 F (38.8 C) (Oral)   Resp 19   SpO2 93%   Physical Exam Vitals and nursing note reviewed.  Constitutional:      General: He is not in acute distress.    Appearance: He is obese.     Comments: Obese 53 year old male appears stated age.  Appears tired and has flat affect.  Follows commands and answers questions appropriately.  Does not appear to be in any pain or acute distress.  HENT:     Head: Normocephalic and atraumatic.     Nose: Nose normal. No congestion.     Mouth/Throat:     Mouth: Mucous membranes are moist.  Eyes:     General: No scleral icterus. Neck:     Comments: No obvious JVD Cardiovascular:     Rate and Rhythm: Normal rate and regular rhythm.     Pulses: Normal pulses.     Heart sounds: Normal heart sounds.     Comments: Pulse is 94 on my exam Pulmonary:     Effort: Pulmonary effort is normal. No respiratory distress.     Breath sounds: No wheezing.     Comments: Lungs clear to auscultation bilaterally.  No crackles Abdominal:     Palpations: Abdomen is soft.     Tenderness: There is no abdominal tenderness.     Comments: Protuberant belly  Musculoskeletal:     Cervical back: Normal range of motion.     Right lower leg: Edema present.     Left lower leg: Edema present.     Comments: Moderate bilateral symmetric leg swelling  Skin:    General: Skin is warm and dry.     Capillary Refill: Capillary refill takes less than 2 seconds.  Neurological:      Mental Status: He is alert. Mental status is at baseline.  Psychiatric:        Mood and Affect: Mood normal.        Behavior: Behavior normal.     ED Results / Procedures / Treatments   Labs (all labs ordered are listed, but only abnormal results are displayed) Labs Reviewed  SARS CORONAVIRUS 2 (TAT 6-24 HRS)    EKG None  Radiology DG Chest Portable 1 View  Result Date: 02/09/2019 CLINICAL DATA:  Cough, rule out infiltrate EXAM: PORTABLE CHEST 1 VIEW COMPARISON:  10/27/2017 FINDINGS: Mild cardiomegaly. There is subtle interstitial opacity of the bilateral lung bases, not significantly changed compared to prior examination dated 10/27/2017. Disc degenerative disease of the thoracic spine. IMPRESSION: Mild cardiomegaly. Subtle interstitial opacity of the bilateral lung bases, not significantly changed compared to prior examination dated 10/27/2017. No convincing acute airspace opacity. Electronically Signed   By: Eddie Candle M.D.   On: 02/09/2019 21:09    Procedures Procedures (including critical care time)  Medications Ordered in ED Medications  acetaminophen (TYLENOL) tablet 1,000 mg (1,000 mg Oral Given 02/09/19 2048)    ED Course  I have reviewed the triage vital signs and the nursing notes.  Pertinent labs & imaging results that were available during my care of the patient were reviewed by me and considered in my medical decision making (see chart for details).  Clinical Course as of Feb 08 2146  Mon Feb 09, 2019  2115 no evidence of pneumonia; no pulmonary edema concerning for heart failure exacerbation  DG Chest Portable 1 View [WF]    Clinical Course User Index [WF] Tedd Sias, [place]   MDM Rules/Calculators/A&P                       Differential for shortness of breath in this patient includes COVID-19 which I think is most likely followed by other upper respiratory tract infection, pneumonia, CHF exacerbation, physical deconditioning, obesity  hypoventilation syndrome, COPD, asthma, pneumothorax, pulmonary embolism, ACS.  Patient is febrile and denies chest pain I suspect that COVID-19 or other viral illness is most likely causing his symptoms however I feel it is also to rule out pneumonia as patient is elderly and has comorbidities.  Will obtain chest x-ray to rule out focal infiltrate.  Physical exam is reassuring.  There are no signs of CHF exacerbation on physical exam including no JVD, no crackles in lungs and no new leg swelling.  No indication for further lab work-up or evaluation at this time.  CXR without evidence of pneumonia or pulmonary edema. Unchanged from last CXR.  Given dose of tylenol and discharged in good condition with return precautions.   Shaun Stokes was evaluated in Emergency Department on 02/09/2019 for the symptoms described in the history of present illness. He was evaluated in the context of the global COVID-19 pandemic, which necessitated consideration that the patient might be at risk for infection with the SARS-CoV-2 virus that causes COVID-19. Institutional protocols and algorithms that pertain to the evaluation of patients at risk for COVID-19 are in a state of rapid change based on information released by regulatory bodies including the CDC and federal and state organizations. These policies and algorithms were followed during the patient's care in the ED.   Final Clinical Impression(s) / ED Diagnoses Final diagnoses:  Suspected COVID-19 virus infection  Viral URI with cough    Rx / DC Orders ED Discharge Orders    None       Tedd Sias, [place] 02/09/19 2147    Lennice Sites, DO 02/09/19 2355

## 2019-02-09 NOTE — Discharge Instructions (Addendum)
Your COVID test is pending; you should expect results in 2-3 days. You can access your results on your MyChart--if you test positive you should receive a phone call.  In the meantime follow CDC guidelines and quarantine, wear a mask, wash hands often.   Please take over the counter vitamin D 2000-4000 units per day. I also recommend zinc 50 mg per day for the next two weeks.   Please return to ED if you feel have difficulty breathing or have emergent, new or concerning symptoms.  Patients who have symptoms consistent with COVID-19 should self isolated for: At least 3 days (72 hours) have passed since recovery, defined as resolution of fever without the use of fever reducing medications and improvement in respiratory symptoms (e.g., cough, shortness of breath), and At least 7 days have passed since symptoms first appeared.       Person Under Monitoring Name: Shaun Stokes  Location: [address] [place] [address]   Infection Prevention Recommendations for Individuals Confirmed to have, or Being Evaluated for, 2019 Novel Coronavirus (COVID-19) Infection Who Receive Care at Home  Individuals who are confirmed to have, or are being evaluated for, COVID-19 should follow the prevention steps below until a healthcare provider or local or state health department says they can return to normal activities.  Stay home except to get medical care You should restrict activities outside your home, except for getting medical care. Do not go to work, school, or public areas, and do not use public transportation or taxis.  Call ahead before visiting your doctor Before your medical appointment, call the healthcare provider and tell them that you have, or are being evaluated for, COVID-19 infection. This will help the healthcare provider's office take steps to keep other people from getting infected. Ask your healthcare provider to call the local or state health department.  Monitor  your symptoms Seek prompt medical attention if your illness is worsening (e.g., difficulty breathing). Before going to your medical appointment, call the healthcare provider and tell them that you have, or are being evaluated for, COVID-19 infection. Ask your healthcare provider to call the local or state health department.  Wear a facemask You should wear a facemask that covers your nose and mouth when you are in the same room with other people and when you visit a healthcare provider. People who live with or visit you should also wear a facemask while they are in the same room with you.  Separate yourself from other people in your home As much as possible, you should stay in a different room from other people in your home. Also, you should use a separate bathroom, if available.  Avoid sharing household items You should not share dishes, drinking glasses, cups, eating utensils, towels, bedding, or other items with other people in your home. After using these items, you should wash them thoroughly with soap and water.  Cover your coughs and sneezes Cover your mouth and nose with a tissue when you cough or sneeze, or you can cough or sneeze into your sleeve. Throw used tissues in a lined trash can, and immediately wash your hands with soap and water for at least 20 seconds or use an alcohol-based hand rub.  Wash your [facility] your hands often and thoroughly with soap and water for at least 20 seconds. You can use an alcohol-based hand sanitizer if soap and water are not available and if your hands are not visibly dirty. Avoid touching your eyes,  nose, and mouth with unwashed hands.   Prevention Steps for Caregivers and Household Members of Individuals Confirmed to have, or Being Evaluated for, COVID-19 Infection Being Cared for in the Home  If you live with, or provide care at home for, a person confirmed to have, or being evaluated for, COVID-19 infection please follow these  guidelines to prevent infection:  Follow healthcare provider's instructions Make sure that you understand and can help the patient follow any healthcare provider instructions for all care.  Provide for the patient's basic needs You should help the patient with basic needs in the home and provide support for getting groceries, prescriptions, and other personal needs.  Monitor the patient's symptoms If they are getting sicker, call his or her medical provider and tell them that the patient has, or is being evaluated for, COVID-19 infection. This will help the healthcare provider's office take steps to keep other people from getting infected. Ask the healthcare provider to call the local or state health department.  Limit the number of people who have contact with the patient If possible, have only one caregiver for the patient. Other household members should stay in another home or place of residence. If this is not possible, they should stay in another room, or be separated from the patient as much as possible. Use a separate bathroom, if available. Restrict visitors who do not have an essential need to be in the home.  Keep older adults, very young children, and other sick people away from the patient Keep older adults, very young children, and those who have compromised immune systems or chronic health conditions away from the patient. This includes people with chronic heart, lung, or kidney conditions, diabetes, and cancer.  Ensure good ventilation Make sure that shared spaces in the home have good air flow, such as from an air conditioner or an opened window, weather permitting.  Wash your hands often Wash your hands often and thoroughly with soap and water for at least 20 seconds. You can use an alcohol based hand sanitizer if soap and water are not available and if your hands are not visibly dirty. Avoid touching your eyes, nose, and mouth with unwashed hands. Use disposable paper  towels to dry your hands. If not available, use dedicated cloth towels and replace them when they become wet.  Wear a facemask and gloves Wear a disposable facemask at all times in the room and gloves when you touch or have contact with the patient's blood, body fluids, and/or secretions or excretions, such as sweat, saliva, sputum, nasal mucus, vomit, urine, or feces.  Ensure the mask fits over your nose and mouth tightly, and do not touch it during use. Throw out disposable facemasks and gloves after using them. Do not reuse. Wash your hands immediately after removing your facemask and gloves. If your personal clothing becomes contaminated, carefully remove clothing and launder. Wash your hands after handling contaminated clothing. Place all used disposable facemasks, gloves, and other waste in a lined container before disposing them with other household waste. Remove gloves and wash your hands immediately after handling these items.  Do not share dishes, glasses, or other household items with the patient Avoid sharing household items. You should not share dishes, drinking glasses, cups, eating utensils, towels, bedding, or other items with a patient who is confirmed to have, or being evaluated for, COVID-19 infection. After the person uses these items, you should wash them thoroughly with soap and water.  Wash laundry thoroughly Immediately remove  and wash clothes or bedding that have blood, body fluids, and/or secretions or excretions, such as sweat, saliva, sputum, nasal mucus, vomit, urine, or feces, on them. Wear gloves when handling laundry from the patient. Read and follow directions on labels of laundry or clothing items and detergent. In general, wash and dry with the warmest temperatures recommended on the label.  Clean all areas the individual has used often Clean all touchable surfaces, such as counters, tabletops, doorknobs, bathroom fixtures, toilets, phones, keyboards, tablets,  and bedside tables, every day. Also, clean any surfaces that may have blood, body fluids, and/or secretions or excretions on them. Wear gloves when cleaning surfaces the patient has come in contact with. Use a diluted bleach solution (e.g., dilute bleach with 1 part bleach and 10 parts water) or a household disinfectant with a label that says EPA-registered for coronaviruses. To make a bleach solution at home, add 1 tablespoon of bleach to 1 quart (4 cups) of water. For a larger supply, add  cup of bleach to 1 gallon (16 cups) of water. Read labels of cleaning products and follow recommendations provided on product labels. Labels contain instructions for safe and effective use of the cleaning product including precautions you should take when applying the product, such as wearing gloves or eye protection and making sure you have good ventilation during use of the product. Remove gloves and wash hands immediately after cleaning.  Monitor yourself for signs and symptoms of illness Caregivers and household members are considered close contacts, should monitor their health, and will be asked to limit movement outside of the home to the extent possible. Follow the monitoring steps for close contacts listed on the symptom monitoring form.   ? If you have additional questions, contact your local health department or call the epidemiologist on call at [phone] (available 24/7). ? This guidance is subject to change. For the most up-to-date guidance from [facility], please refer to their website: [email or web address]

## 2019-02-10 LAB — SARS CORONAVIRUS 2 (TAT 6-24 HRS): SARS Coronavirus 2: POSITIVE — AB

## 2019-02-11 ENCOUNTER — Telehealth: Payer: Self-pay | Admitting: Infectious Diseases

## 2019-02-11 NOTE — Telephone Encounter (Signed)
Called to discuss with patient about Covid symptoms and the use of bamlanivimab, a monoclonal antibody infusion for those with mild to moderate Covid symptoms and at a high risk of hospitalization.  Pt is qualified for this infusion at the [facility] infusion center due to BMI>35.   Per ER documentation symptoms started 12/25. Would be eligible thorough 02/16/19.    Message left to call back.

## 2019-02-20 ENCOUNTER — Ambulatory Visit: Payer: Managed Care, Other (non HMO) | Attending: Internal Medicine

## 2019-02-20 DIAGNOSIS — Z20822 Contact with and (suspected) exposure to covid-19: Secondary | ICD-10-CM

## 2019-02-22 LAB — NOVEL CORONAVIRUS, NAA: SARS-CoV-2, NAA: NOT DETECTED

## 2019-02-24 ENCOUNTER — Encounter (HOSPITAL_COMMUNITY): Payer: Self-pay | Admitting: Emergency Medicine

## 2019-02-24 ENCOUNTER — Emergency Department (HOSPITAL_COMMUNITY)
Admission: EM | Admit: 2019-02-24 | Discharge: 2019-02-25 | Disposition: A | Discharge status: Home / Self Care | Payer: Managed Care, Other (non HMO) | Attending: Emergency Medicine | Admitting: Emergency Medicine

## 2019-02-24 DIAGNOSIS — I10 Essential (primary) hypertension: Secondary | ICD-10-CM | POA: Insufficient documentation

## 2019-02-24 DIAGNOSIS — R42 Dizziness and giddiness: Secondary | ICD-10-CM | POA: Insufficient documentation

## 2019-02-24 DIAGNOSIS — Z87891 Personal history of nicotine dependence: Secondary | ICD-10-CM | POA: Insufficient documentation

## 2019-02-24 LAB — URINALYSIS, ROUTINE W REFLEX MICROSCOPIC
Bacteria, UA: NONE SEEN
Bilirubin Urine: NEGATIVE
Glucose, UA: NEGATIVE mg/dL
Hgb urine dipstick: NEGATIVE
Ketones, ur: 5 mg/dL — AB
Leukocytes,Ua: NEGATIVE
Nitrite: NEGATIVE
Protein, ur: 100 mg/dL — AB
Specific Gravity, Urine: 1.019 (ref 1.005–1.030)
pH: 5 (ref 5.0–8.0)

## 2019-02-24 LAB — CBC
HCT: 42.2 % (ref 39.0–52.0)
Hemoglobin: 14.1 g/dL (ref 13.0–17.0)
MCH: 30.9 pg (ref 26.0–34.0)
MCHC: 33.4 g/dL (ref 30.0–36.0)
MCV: 92.3 fL (ref 80.0–100.0)
Platelets: 289 10*3/uL (ref 150–400)
RBC: 4.57 MIL/uL (ref 4.22–5.81)
RDW: 14.1 % (ref 11.5–15.5)
WBC: 9.6 10*3/uL (ref 4.0–10.5)
nRBC: 0 % (ref 0.0–0.2)

## 2019-02-24 LAB — BASIC METABOLIC PANEL
Anion gap: 17 — ABNORMAL HIGH (ref 5–15)
BUN: <5 mg/dL — ABNORMAL LOW (ref 6–20)
CO2: 17 mmol/L — ABNORMAL LOW (ref 22–32)
Calcium: 9 mg/dL (ref 8.9–10.3)
Chloride: 97 mmol/L — ABNORMAL LOW (ref 98–111)
Creatinine, Ser: 0.88 mg/dL (ref 0.61–1.24)
GFR calc Af Amer: >60 mL/min (ref >60)
GFR calc non Af Amer: >60 mL/min (ref >60)
Glucose, Bld: 111 mg/dL — ABNORMAL HIGH (ref 70–99)
Potassium: 3.8 mmol/L (ref 3.5–5.1)
Sodium: 131 mmol/L — ABNORMAL LOW (ref 135–145)

## 2019-02-24 MED ORDER — SODIUM CHLORIDE 0.9% FLUSH: 3.0000 mL | Freq: Once | INTRAVENOUS | Status: DC

## 2019-02-24 NOTE — ED Triage Notes (Addendum)
Patient presents to the ED by EMS with c/o dizziness since this morning around 0700. Took his bp medication that made him feel worse. He is hyperventilating with EMS oxygen sat 98% room. He is diaphoretic a/o x4  Re-tested for Covid-19 3 days ago with negative result.

## 2019-02-25 NOTE — ED Provider Notes (Signed)
[facility] Provider Note   CSN: [id number] Arrival date & time: 02/24/19  1821     History Chief Complaint  Patient presents with  . Dizziness    Shaun Stokes is a 54 y.o. male.  The history is provided by the patient.  Dizziness Quality:  Lightheadedness Severity:  Moderate Onset quality:  Gradual Duration:  1 day Timing: once and brief. Progression:  Resolved Chronicity:  New Context: standing up   Relieved by:  Nothing Worsened by:  Nothing Associated symptoms: no blood in stool, no chest pain, no diarrhea, no headaches, no hearing loss, no nausea, no palpitations, no shortness of breath, no syncope, no tinnitus, no vision changes, no vomiting and no weakness   Risk factors comment:  Dx with covid 2 weeks ago      Past Medical History:  Diagnosis Date  . Alcohol abuse   . Anxiety   . Chest pain   . Dizziness    r/t anxiety, alcohol withdrawal  . Hyperlipemia   . Hypertension   . Insomnia   . Obesity   . OSA (obstructive sleep apnea) 08/24/2014  . Prediabetes   . Vitamin D deficiency     Patient Active Problem List   Diagnosis Date Noted  . Alcohol abuse with alcohol-induced mood disorder ([place]) 12/14/2017  . Essential hypertension 04/27/2015  . Dyspnea 04/26/2015  . OSA (obstructive sleep apnea) 08/24/2014  . Streptococcal sore throat 06/04/2014  . Pharyngitis 06/02/2014  . Leukocytosis 06/02/2014  . Severe obesity (BMI >= 40) ([place]) 06/02/2014  . Pharyngitis, acute 06/02/2014  . Prediabetes   . Vitamin D deficiency     Past Surgical History:  Procedure Laterality Date  . COLONOSCOPY WITH PROPOFOL N/A 09/16/2015   Procedure: COLONOSCOPY WITH PROPOFOL;  Surgeon: Carol Ada, MD;  Location: [facility] END[facility]COPY;  Service: Endoscopy;  Laterality: N/A;  . NO PAST SURGERIES         Family History  Problem Relation Age of Onset  . Heart attack Father     Social History   Tobacco Use  . Smoking status: Former  Smoker    Packs/day: 1.00    Years: 32.00    Pack years: 32.00    Types: Cigarettes    Quit date: 11/18/2014    Years since quitting: 4.2  . Smokeless tobacco: Never Used  Substance Use Topics  . Alcohol use: Yes    Comment: a fifth a day sometimes a pint  . Drug use: No    Home Medications Prior to Admission medications   Medication Sig Start Date End Date Taking? Authorizing Provider  aspirin EC 81 MG tablet Take 81 mg by mouth daily.    [provider]  carbamazepine (TEGRETOL) 200 MG tablet Beginning on morning of 12/16/17: Take 600mg  PO QD X 1D, then 400mg  QD X 1D, then 200mg  PO QD X 2D Patient taking differently: Take 200 mg by mouth daily.  12/30/17   McDonald, Mia A, PA-C  carvedilol (COREG) 3.125 MG tablet Take 3.125 mg by mouth 2 (two) times daily. 08/23/15   [provider]  chlordiazePOXIDE (LIBRIUM) 25 MG capsule 50mg  PO TID x 1D, then 25-50mg  PO BID X 1D, then 25-50mg  PO QD X 1D 01/13/18   Francine Graven, DO  folic acid (FOLVITE) 1 MG tablet Take 1 mg by mouth daily.    [provider]  furosemide (LASIX) 40 MG tablet Take 40 mg by mouth daily.  08/23/15   [provider]  gabapentin (NEURONTIN) 300 MG capsule Take 1 capsule (300 mg total) by mouth 3 (three) times daily. 12/14/17   Patrecia Pour, NP  losartan (COZAAR) 25 MG tablet Take 1 tablet (25 mg total) by mouth daily. 12/15/17   Patrecia Pour, NP    Allergies    Patient has no known allergies.  Review of Systems   Review of Systems  Constitutional: Negative for chills and fever.  HENT: Negative for ear pain, hearing loss, sore throat and tinnitus.   Eyes: Negative for pain and visual disturbance.  Respiratory: Negative for cough and shortness of breath.   Cardiovascular: Negative for chest pain, palpitations and syncope.  Gastrointestinal: Negative for abdominal pain, blood in stool, diarrhea, nausea and vomiting.  Genitourinary: Negative for dysuria and hematuria.    Musculoskeletal: Negative for arthralgias and back pain.  Skin: Negative for color change and rash.  Neurological: Positive for dizziness. Negative for seizures, syncope, weakness and headaches.  All other systems reviewed and are negative.   Physical Exam Updated Vital Signs  ED Triage Vitals  Enc Vitals Group     BP 02/24/19 1828 (!) 148/93     Pulse Rate 02/24/19 1828 (!) 109     Resp 02/24/19 1828 (!) 40     Temp 02/24/19 1828 98 F (36.7 C)     Temp Source 02/24/19 1828 Oral     SpO2 02/24/19 1822 98 %     Weight --      Height --      Head Circumference --      Peak Flow --      Pain Score 02/24/19 1826 0     Pain Loc --      Pain Edu? --      Excl. in [place]? --     Physical Exam Vitals and nursing note reviewed.  Constitutional:      General: He is not in acute distress.    Appearance: He is well-developed. He is not ill-appearing.  HENT:     Head: Normocephalic and atraumatic.     Nose: Nose normal.     Mouth/Throat:     Mouth: Mucous membranes are moist.  Eyes:     Extraocular Movements: Extraocular movements intact.     Conjunctiva/sclera: Conjunctivae normal.     Pupils: Pupils are equal, round, and reactive to light.  Cardiovascular:     Rate and Rhythm: Normal rate and regular rhythm.     Heart sounds: No murmur.  Pulmonary:     Effort: Pulmonary effort is normal. No respiratory distress.     Breath sounds: Normal breath sounds.  Abdominal:     Palpations: Abdomen is soft.     Tenderness: There is no abdominal tenderness.  Musculoskeletal:        General: Normal range of motion.     Cervical back: Normal range of motion and neck supple.  Skin:    General: Skin is warm and dry.  Neurological:     General: No focal deficit present.     Mental Status: He is alert and oriented to person, place, and time.     Cranial Nerves: No cranial nerve deficit.     Sensory: No sensory deficit.     Motor: No weakness.     Coordination: Coordination normal.      Gait: Gait normal.     Comments: 5+ out of 5 strength throughout, normal sensation, normal gait, normal speech, normal finger-to-nose finger, no visual field deficit  Psychiatric:        Mood and Affect: Mood normal.     ED Results / Procedures / Treatments   Labs (all labs ordered are listed, but only abnormal results are displayed) Labs Reviewed  BASIC METABOLIC PANEL - Abnormal; Notable for the following components:      Result Value   Sodium 131 (*)    Chloride 97 (*)    CO2 17 (*)    Glucose, Bld 111 (*)    BUN <5 (*)    Anion gap 17 (*)    All other components within normal limits  URINALYSIS, ROUTINE W REFLEX MICROSCOPIC - Abnormal; Notable for the following components:   Color, Urine AMBER (*)    Ketones, ur 5 (*)    Protein, ur 100 (*)    All other components within normal limits  CBC  CBG MONITORING, ED    EKG EKG Interpretation  Date/Time:  Tuesday February 24 2019 18:30:02 EST Ventricular Rate:  112 PR Interval:  168 QRS Duration: 72 QT Interval:  344 QTC Calculation: 469 R Axis:   54 Text Interpretation: Sinus tachycardia Possible Anterior infarct , age undetermined Abnormal ECG Confirmed by Lennice Sites [phone]) on 02/25/2019 8:03:04 AM   Radiology No results found.  Procedures Procedures (including critical care time)  Medications Ordered in ED Medications  sodium chloride flush (NS) 0.9 % injection 3 mL (has no administration in time range)    ED Course  I have reviewed the triage vital signs and the nursing notes.  Pertinent labs & imaging results that were available during my care of the patient were reviewed by me and considered in my medical decision making (see chart for details).    MDM Rules/Calculators/A&P   Shaun Stokes is a 54 year old male with history of alcohol abuse who presents to the ED with dizziness.  Symptoms happened yesterday morning and have now resolved.  He has been in the waiting room for the last 15 hours fairly  asymptomatic.  Lab work has ready been performed that shows no significant anemia, electrolyte abnormality, kidney injury.  Urinalysis does not appear to show infection.  EKG shows sinus tachycardia but no ischemic changes.  Patient with no chest pain or shortness of breath.  Normal neurological exam.  Overall asymptomatic on my evaluation.  No concern for vertigo or stroke.  It appears that primarily his diet is alcohol and in the setting of Covid infection likely has some mild dehydration.  Educated about keeping a good diet and hydration.  He denies any severe respiratory symptoms or cough.  Recommend continue hydration at home and discharged in the ED in good condition.  Given return precautions.  This chart was dictated using voice recognition software.  Despite best efforts to proofread,  errors can occur which can change the documentation meaning.    Final Clinical Impression(s) / ED Diagnoses Final diagnoses:  Dizziness    Rx / DC Orders ED Discharge Orders    None       Lennice Sites, DO 02/25/19 [id number]

## 2019-11-11 IMAGING — CT CT HEAD W/O CM
4 series · 15 of 47 positions shown, 17 images · non-contrast
Comparison: Neck CT 06/02/2014

CLINICAL DATA: 52-year-old with shortness of breath for 2 weeks.

EXAM:
CT HEAD WITHOUT CONTRAST
TECHNIQUE: Contiguous axial images were obtained from the base of the skull
through the vertex without intravenous contrast.

[Series 3: head without · axial · non-contrast · 0.47mm/px · z∈[-105,+20]mm · 7 of 35 slices shown, 9 images]
[im 5/35  brain]
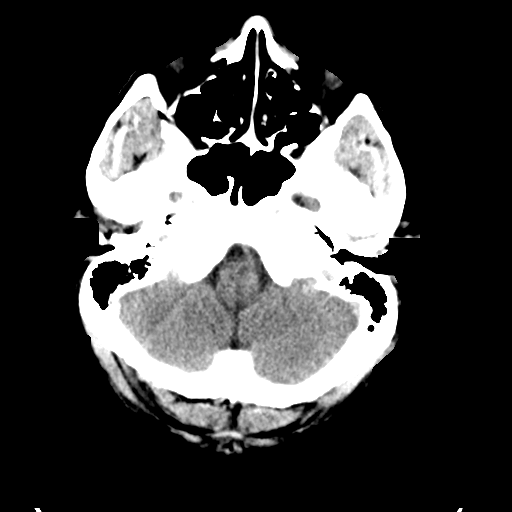
[im 5/35  bone]
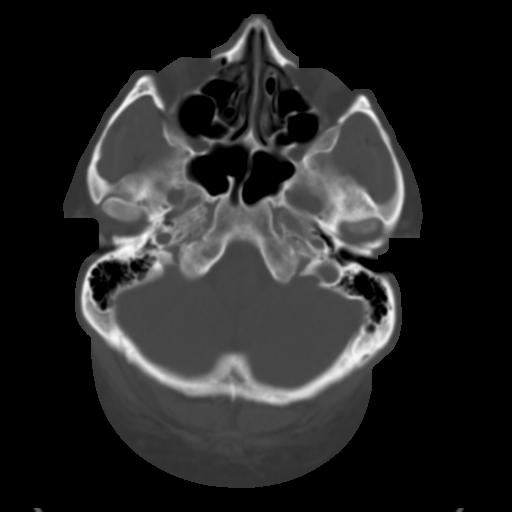
[im 9/35  brain]
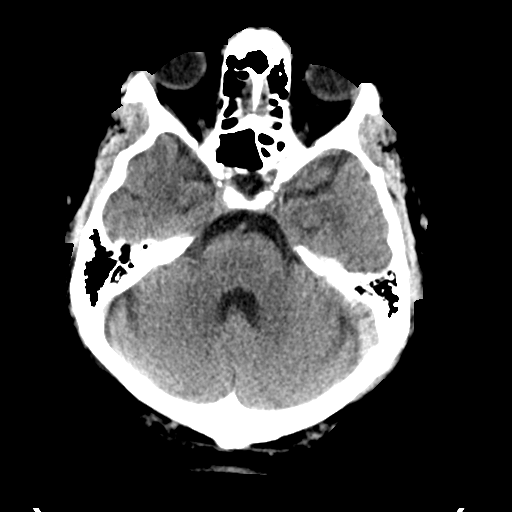
[im 13/35  brain]
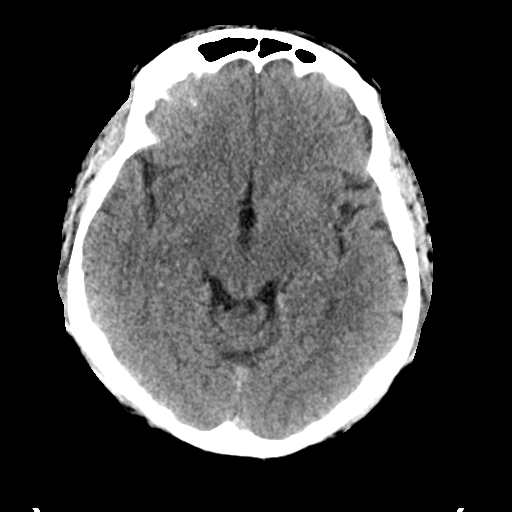
[im 18/35  brain]
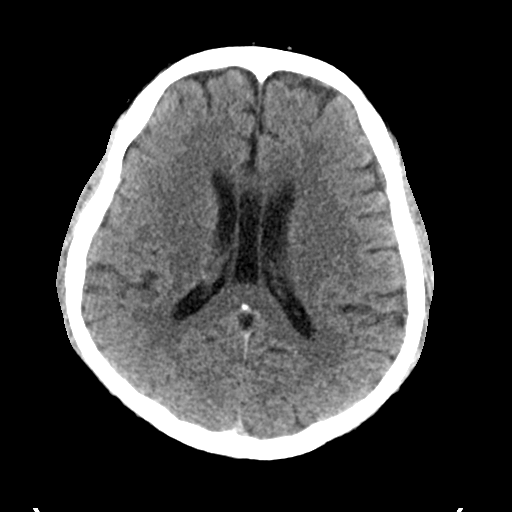
[im 22/35  brain]
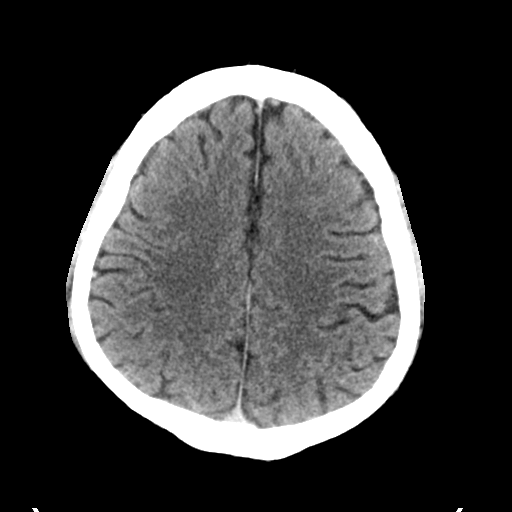
[im 22/35  bone]
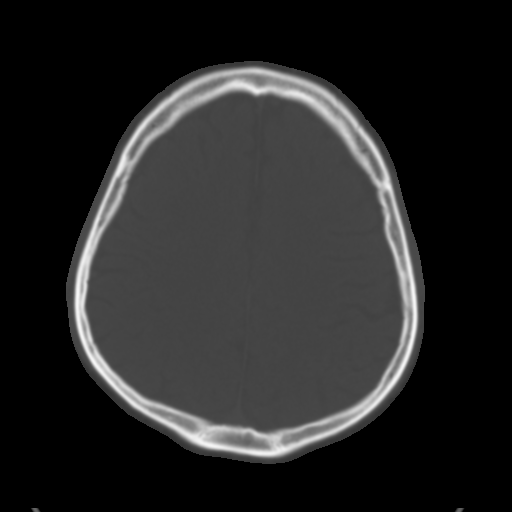
[im 26/35  brain]
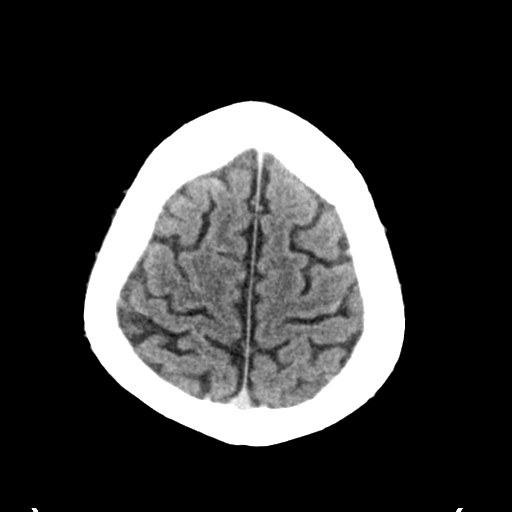
[im 30/35  brain]
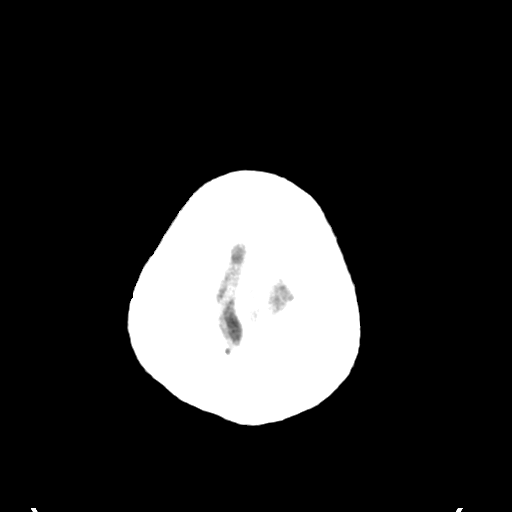

[Series 4: head bone · axial · 0.47mm/px · z∈[-109,-91]mm · 2 of 87 slices shown]
[im 9/87  bone]
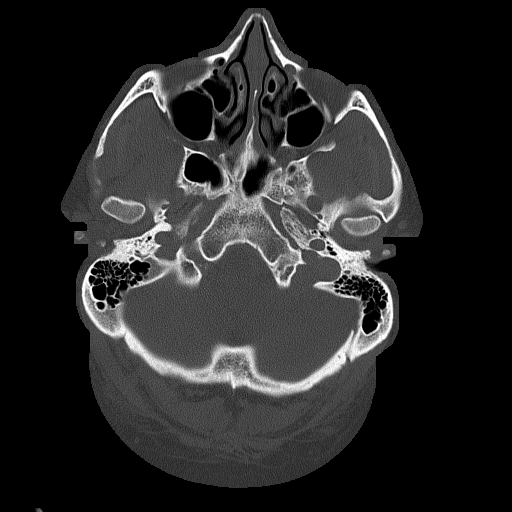
[im 18/87  bone]
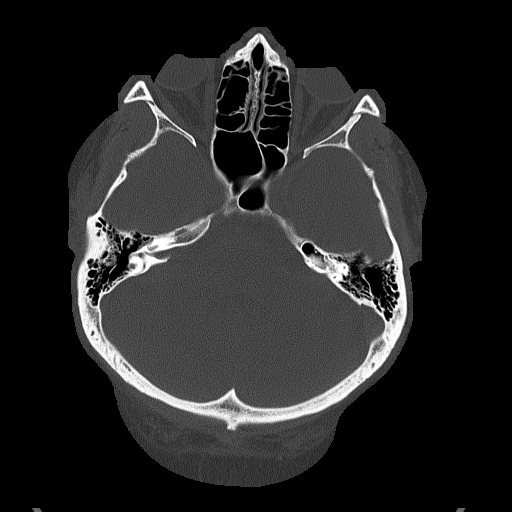

[Series 5: head without cor · coronal · non-contrast · 0.34mm/px · 3 of 74 slices shown]
[im 25/74  brain]
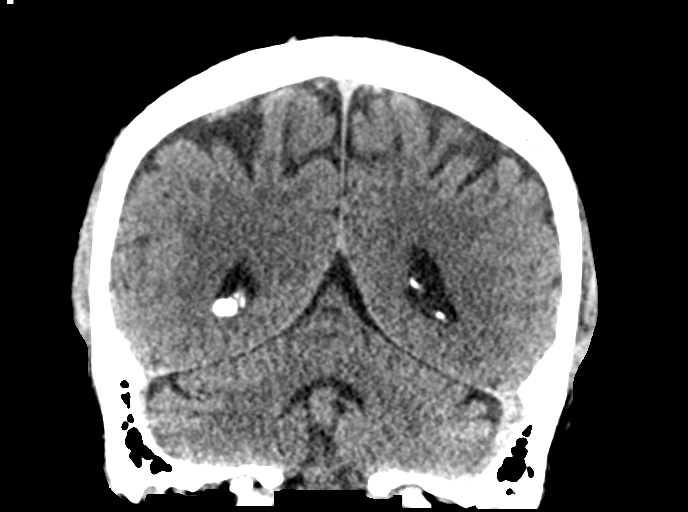
[im 33/74  brain]
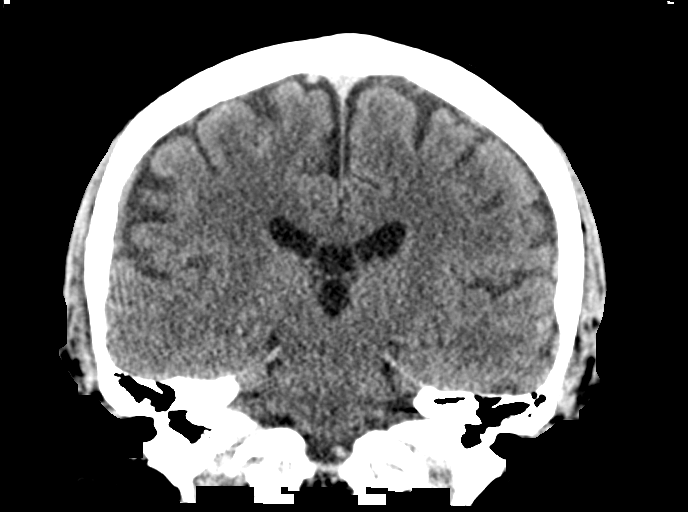
[im 41/74  brain]
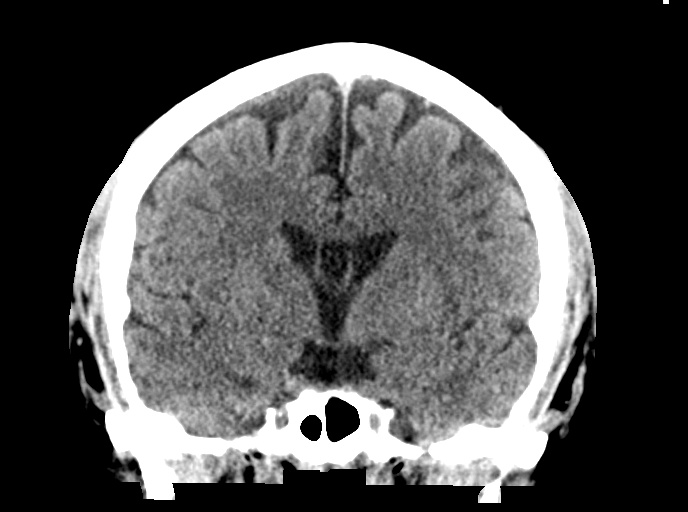

[Series 6: head without sag · sagittal · non-contrast · 0.34mm/px · 3 of 67 slices shown]
[im 23/67  brain]
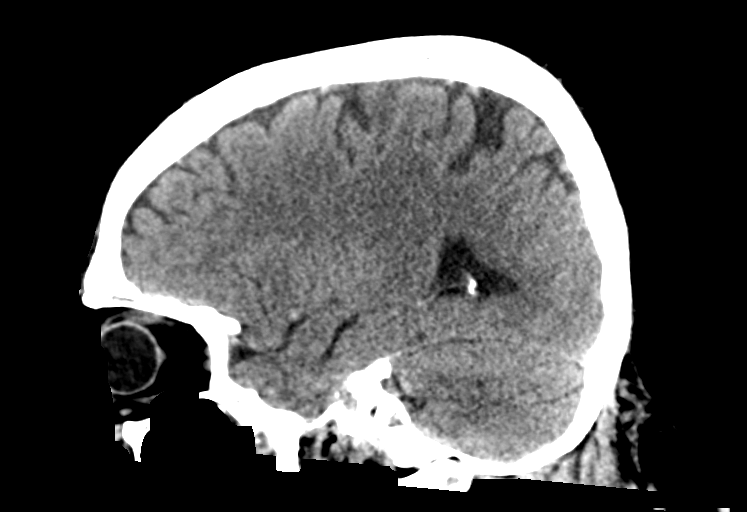
[im 34/67  brain]
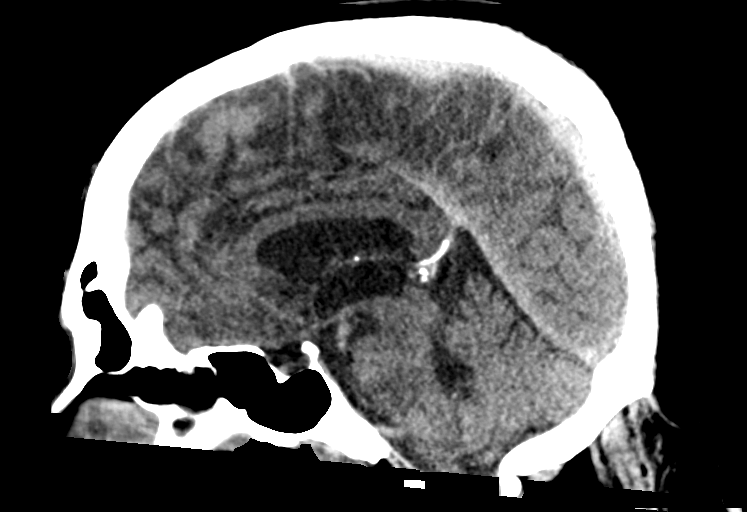
[im 45/67  brain]
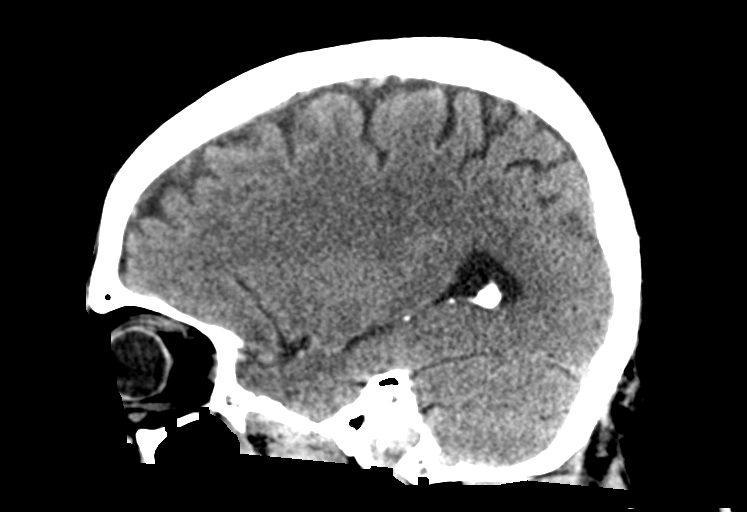

[15 of 47 positions shown; findings below may reference images not displayed]

FINDINGS: Brain: No evidence for acute hemorrhage, mass lesion, midline shift,
hydrocephalus or large infarct. Incidentally, there is a cavum
septum pellucidum et vergae.

Vascular: No hyperdense vessel or unexpected calcification.

Skull: Negative for fracture. There is a small area of ground-glass
density just above the right orbit and lateral to the right frontal
sinus. There is no evidence for cortical destruction is area. This
could represent a small focus of fibrous dysplasia. This area
measures up to 1.6 cm on the lateral image, sequence 6, image 23.

Sinuses/Orbits: Visualized paranasal sinuses and mastoid air cells
are clear.

Other: None.
IMPRESSION: No acute intracranial abnormality.

Probable small focus of fibrous dysplasia in the right supraorbital
region. This is likely an incidental finding.

## 2020-05-02 ENCOUNTER — Other Ambulatory Visit: Payer: Self-pay | Admitting: Gastroenterology

## 2020-05-09 ENCOUNTER — Other Ambulatory Visit: Payer: Self-pay

## 2020-05-10 ENCOUNTER — Other Ambulatory Visit (HOSPITAL_COMMUNITY)
Admission: RE | Admit: 2020-05-10 | Discharge: 2020-05-10 | Disposition: A | Discharge status: Home / Self Care | Payer: Managed Care, Other (non HMO) | Source: Ambulatory Visit | Attending: Gastroenterology | Admitting: Gastroenterology

## 2020-05-10 DIAGNOSIS — Z20822 Contact with and (suspected) exposure to covid-19: Secondary | ICD-10-CM | POA: Insufficient documentation

## 2020-05-10 DIAGNOSIS — Z01812 Encounter for preprocedural laboratory examination: Secondary | ICD-10-CM | POA: Insufficient documentation

## 2020-05-10 LAB — SARS CORONAVIRUS 2 (TAT 6-24 HRS): SARS Coronavirus 2: NEGATIVE

## 2020-05-12 NOTE — Anesthesia Preprocedure Evaluation (Addendum)
Anesthesia Evaluation  Patient identified by MRN, date of birth, ID band Patient awake    Reviewed: Allergy & Precautions, NPO status , Patient's Chart, lab work & pertinent test results  Airway Mallampati: III  TM Distance: >3 FB Neck ROM: Full    Dental no notable dental hx. (+) Teeth Intact, Dental Advisory Given   Pulmonary sleep apnea (RX CPAP does not use) and Continuous Positive Airway Pressure Ventilation ,    Pulmonary exam normal breath sounds clear to auscultation       Cardiovascular hypertension, Pt. on medications Normal cardiovascular exam Rhythm:Regular Rate:Normal     Neuro/Psych negative neurological ROS     GI/Hepatic Neg liver ROS,   Endo/Other  negative endocrine ROS  Renal/GU      Musculoskeletal negative musculoskeletal ROS (+)   Abdominal (+) + obese,   Peds  Hematology   Anesthesia Other Findings   Reproductive/Obstetrics                            Anesthesia Physical Anesthesia Plan  ASA: III  Anesthesia Plan: MAC   Post-op Pain Management:    Induction:   PONV Risk Score and Plan: Treatment may vary due to age or medical condition  Airway Management Planned: Natural Airway and Simple Face Mask  Additional Equipment: None  Intra-op Plan:   Post-operative Plan:   Informed Consent: I have reviewed the patients History and Physical, chart, labs and discussed the procedure including the risks, benefits and alternatives for the proposed anesthesia with the patient or authorized representative who has indicated his/her understanding and acceptance.     Dental advisory given  Plan Discussed with: CRNA  Anesthesia Plan Comments: (Colonoscopy for rectal bleeding)       Anesthesia Quick Evaluation

## 2020-05-13 ENCOUNTER — Encounter (HOSPITAL_COMMUNITY): Payer: Self-pay | Admitting: Gastroenterology

## 2020-05-13 ENCOUNTER — Ambulatory Visit (HOSPITAL_COMMUNITY): Payer: Managed Care, Other (non HMO) | Admitting: Anesthesiology

## 2020-05-13 ENCOUNTER — Other Ambulatory Visit: Payer: Self-pay

## 2020-05-13 ENCOUNTER — Ambulatory Visit (HOSPITAL_COMMUNITY)
Admission: RE | Admit: 2020-05-13 | Discharge: 2020-05-13 | Disposition: A | Discharge status: Home / Self Care | Payer: Managed Care, Other (non HMO) | Attending: Gastroenterology | Admitting: Gastroenterology

## 2020-05-13 ENCOUNTER — Encounter (HOSPITAL_COMMUNITY)
Admission: RE | Disposition: A | Discharge status: Home / Self Care | Payer: Self-pay | Source: Home / Self Care | Attending: Gastroenterology

## 2020-05-13 DIAGNOSIS — Z87891 Personal history of nicotine dependence: Secondary | ICD-10-CM | POA: Diagnosis not present

## 2020-05-13 DIAGNOSIS — Z86010 Personal history of colonic polyps: Secondary | ICD-10-CM | POA: Diagnosis not present

## 2020-05-13 DIAGNOSIS — K921 Melena: Secondary | ICD-10-CM | POA: Diagnosis present

## 2020-05-13 HISTORY — PX: COLONOSCOPY WITH PROPOFOL: SHX5780

## 2020-05-13 SURGERY — COLONOSCOPY WITH PROPOFOL
Anesthesia: Monitor Anesthesia Care

## 2020-05-13 MED ORDER — PHENYLEPHRINE 40 MCG/ML (10ML) SYRINGE FOR IV PUSH (FOR BLOOD PRESSURE SUPPORT)
PREFILLED_SYRINGE | INTRAVENOUS | Status: DC | PRN
  Administered 2020-05-13: 120 ug via INTRAVENOUS

## 2020-05-13 MED ORDER — PROPOFOL 500 MG/50ML IV EMUL
INTRAVENOUS | Status: DC | PRN
  Administered 2020-05-13: 10 mg via INTRAVENOUS
  Administered 2020-05-13 (×2): 20 mg via INTRAVENOUS

## 2020-05-13 MED ORDER — LACTATED RINGERS IV SOLN: INTRAVENOUS | Status: DC

## 2020-05-13 MED ORDER — LIDOCAINE 2% (20 MG/ML) 5 ML SYRINGE
INTRAMUSCULAR | Status: DC | PRN
  Administered 2020-05-13: 80 mg via INTRAVENOUS

## 2020-05-13 MED ORDER — SODIUM CHLORIDE 0.9 % IV SOLN: INTRAVENOUS | Status: DC

## 2020-05-13 MED ORDER — PROPOFOL 10 MG/ML IV BOLUS
INTRAVENOUS | Status: DC | PRN
  Administered 2020-05-13: 100 ug/kg/min via INTRAVENOUS

## 2020-05-13 SURGICAL SUPPLY — 22 items

## 2020-05-13 NOTE — Anesthesia Postprocedure Evaluation (Signed)
Anesthesia Post Note  Patient: Shaun Stokes  Procedure(s) Performed: COLONOSCOPY WITH PROPOFOL (N/A )     Patient location during evaluation: Endoscopy Anesthesia Type: MAC Level of consciousness: awake and alert Pain management: pain level controlled Vital Signs Assessment: post-procedure vital signs reviewed and stable Respiratory status: spontaneous breathing, nonlabored ventilation, respiratory function stable and patient connected to nasal cannula oxygen Cardiovascular status: blood pressure returned to baseline and stable Postop Assessment: no apparent nausea or vomiting Anesthetic complications: no   No complications documented.  Last Vitals:  Vitals:   05/13/20 0810 05/13/20 0820  BP: 123/73 123/73  Pulse: 86 90  Resp: 20 16  Temp:    SpO2: 97% 98%    Last Pain:  Vitals:   05/13/20 0820  TempSrc:   PainSc: 0-No pain                 Barnet Glasgow

## 2020-05-13 NOTE — Discharge Instructions (Signed)

## 2020-05-13 NOTE — H&P (Signed)
  Shaun Stokes   HPI: The patient's colonoscopy on 09/16/2015 was positive for a small tubular adenoma. He currently complains about some hematochezia. His symptoms of bleeding started gradually. The bleeding is associated with having a bowel movement and it is painless. The patient denies any issues with proctalgia. He denies any issues with chest pain, SOB, or MI.     Past Medical History:  Diagnosis Date  . Alcohol abuse   . Anxiety   . Chest pain   . Dizziness    r/t anxiety, alcohol withdrawal  . Hyperlipemia   . Hypertension   . Insomnia   . Obesity   . OSA (obstructive sleep apnea) 08/24/2014  . Prediabetes   . Vitamin D deficiency     Past Surgical History:  Procedure Laterality Date  . COLONOSCOPY WITH PROPOFOL N/A 09/16/2015   Procedure: COLONOSCOPY WITH PROPOFOL;  Surgeon: Carol Ada, MD;  Location: [facility];  Service: Endoscopy;  Laterality: N/A;  . NO PAST SURGERIES      Family History  Problem Relation Age of Onset  . Heart attack Father     Social History:  reports that he quit smoking about 5 years ago. His smoking use included cigarettes. He has a 32.00 pack-year smoking history. He has never used smokeless tobacco. He reports current alcohol use. He reports that he does not use drugs.  Allergies: No Known Allergies  Medications:  Scheduled:  Continuous: . sodium chloride    . lactated ringers 10 mL/hr at 05/13/20 0721    No results found for this or any previous visit (from the past 24 hour(s)).   No results found.  ROS:  As stated above in the HPI otherwise negative.  Blood pressure 132/89, pulse 98, temperature 98.7 F (37.1 C), temperature source Oral, resp. rate 19, height 6' (1.829 m), weight (!) 163.3 kg, SpO2 100 %.    PE: Gen: NAD, Alert and Oriented HEENT:  [place]/AT, EOMI Neck: Supple, no LAD Lungs: CTA Bilaterally CV: RRR without M/G/R ABD: Soft, NTND, +BS Ext: No C/C/E  Assessment/Plan: 1) Hematochezia. 2) Heme positive  stool.  Plan: 1) Colonoscopy.  Nima Bamburg D 05/13/2020, 7:22 AM

## 2020-05-13 NOTE — Progress Notes (Signed)
Pt told when he got here that he needed a ride home. Patient stated his sister was coming to get him. Phone number written down.  After procedure sister called by RN and she stated she was coming. Patient talking with staff and saying he felt like he could drive home. This RN explained to him he could not drive home. DR. Valma Cava made aware of patient wanting to drive home. Dr. Valma Cava in to talk with patient and made him aware that he could not drive home.

## 2020-05-13 NOTE — Op Note (Signed)
[facility] Patient Name: Shaun Stokes Procedure Date: 05/13/2020 MRN: [id number] Attending MD: Carol Ada , MD Date of Birth: [birth year] CSN: [id number] Age: 55 Admit Type: Outpatient Procedure:                Colonoscopy Indications:              Hematochezia, Heme positive stool Providers:                Carol Ada, MD, Brooke Person, Carmie End,                            RN, Fransico Setters Mbumina, Technician, Caryl Pina                            CRNA Referring MD:              Medicines:                Propofol per Anesthesia Complications:            No immediate complications. Estimated Blood Loss:     Estimated blood loss: none. Procedure:                Pre-Anesthesia Assessment:                           - Prior to the procedure, a History and Physical                            was performed, and patient medications and                            allergies were reviewed. The patient's tolerance of                            previous anesthesia was also reviewed. The risks                            and benefits of the procedure and the sedation                            options and risks were discussed with the patient.                            All questions were answered, and informed consent                            was obtained. Prior Anticoagulants: The patient has                            taken no previous anticoagulant or antiplatelet                            agents. ASA Grade Assessment: III - A patient with                            severe systemic disease.  After reviewing the risks                            and benefits, the patient was deemed in                            satisfactory condition to undergo the procedure.                           - Sedation was administered by an anesthesia                            professional. Deep sedation was attained.                           After obtaining informed consent, the  colonoscope                            was passed under direct vision. Throughout the                            procedure, the patient's blood pressure, pulse, and                            oxygen saturations were monitored continuously. The                            CF-HQ190L ([id number] Olympus colonoscope was                            introduced through the anus and advanced to the the                            terminal ileum. The colonoscopy was performed                            without difficulty. The patient tolerated the                            procedure well. The quality of the bowel                            preparation was good. The ileocecal valve,                            appendiceal orifice, and rectum were photographed. Scope In: 7:38:39 AM Scope Out: 7:49:56 AM Scope Withdrawal Time: 0 hours 8 minutes 2 seconds  Total Procedure Duration: 0 hours 11 minutes 17 seconds  Findings:      The entire examined colon appeared normal. Impression:               - The entire examined colon is normal.                           - No specimens collected. Moderate Sedation:  Not Applicable - Patient had care per Anesthesia. Recommendation:           - Patient has a contact number available for                            emergencies. The signs and symptoms of potential                            delayed complications were discussed with the                            patient. Return to normal activities tomorrow.                            Written discharge instructions were provided to the                            patient.                           - Resume previous diet.                           - Continue present medications.                           - Repeat colonoscopy in 10 years for screening                            purposes. Procedure Code(s):        --- Professional ---                           [phone], Colonoscopy, flexible; diagnostic, including                             collection of specimen(s) by brushing or washing,                            when performed (separate procedure) Diagnosis Code(s):        --- Professional ---                           K92.1, Melena (includes Hematochezia)                           R19.5, Other fecal abnormalities CPT copyright 2019 American Medical Association. All rights reserved. The codes documented in this report are preliminary and upon coder review may  be revised to meet current compliance requirements. Carol Ada, MD Carol Ada, MD 05/13/2020 7:55:37 AM This report has been signed electronically. Number of Addenda: 0

## 2020-05-13 NOTE — Progress Notes (Signed)
Nurse transported patient to hospital entrance and placed patient in family member car.

## 2020-05-13 NOTE — Transfer of Care (Signed)
Immediate Anesthesia Transfer of Care Note  Patient: Shaun Stokes  Procedure(s) Performed: COLONOSCOPY WITH PROPOFOL (N/A )  Patient Location: Endoscopy Unit  Anesthesia Type:MAC  Level of Consciousness: awake, alert , oriented and patient cooperative  Airway & Oxygen Therapy: Patient Spontanous Breathing and Patient connected to face mask oxygen  Post-op Assessment: Report given to RN, Post -op Vital signs reviewed and stable and Patient moving all extremities  Post vital signs: Reviewed and stable  Last Vitals:  Vitals Value Taken Time  BP    Temp    Pulse    Resp    SpO2      Last Pain:  Vitals:   05/13/20 0714  TempSrc: Oral  PainSc: 0-No pain         Complications: No complications documented.

## 2020-05-15 ENCOUNTER — Encounter (HOSPITAL_COMMUNITY): Payer: Self-pay | Admitting: Gastroenterology

## 2020-09-30 ENCOUNTER — Ambulatory Visit: Payer: Managed Care, Other (non HMO) | Admitting: Allergy

## 2021-01-15 ENCOUNTER — Encounter (HOSPITAL_COMMUNITY): Payer: Self-pay | Admitting: Emergency Medicine

## 2021-01-15 ENCOUNTER — Emergency Department (HOSPITAL_COMMUNITY)
Admission: EM | Admit: 2021-01-15 | Discharge: 2021-01-16 | Disposition: A | Discharge status: Home / Self Care | Payer: Self-pay | Attending: Emergency Medicine | Admitting: Emergency Medicine

## 2021-01-15 ENCOUNTER — Emergency Department (HOSPITAL_COMMUNITY): Payer: Self-pay

## 2021-01-15 ENCOUNTER — Other Ambulatory Visit: Payer: Self-pay

## 2021-01-15 DIAGNOSIS — K760 Fatty (change of) liver, not elsewhere classified: Secondary | ICD-10-CM | POA: Insufficient documentation

## 2021-01-15 DIAGNOSIS — R600 Localized edema: Secondary | ICD-10-CM | POA: Insufficient documentation

## 2021-01-15 DIAGNOSIS — Z7982 Long term (current) use of aspirin: Secondary | ICD-10-CM | POA: Insufficient documentation

## 2021-01-15 DIAGNOSIS — R140 Abdominal distension (gaseous): Secondary | ICD-10-CM | POA: Insufficient documentation

## 2021-01-15 DIAGNOSIS — I10 Essential (primary) hypertension: Secondary | ICD-10-CM | POA: Insufficient documentation

## 2021-01-15 DIAGNOSIS — Z79899 Other long term (current) drug therapy: Secondary | ICD-10-CM | POA: Insufficient documentation

## 2021-01-15 DIAGNOSIS — R0602 Shortness of breath: Secondary | ICD-10-CM | POA: Insufficient documentation

## 2021-01-15 DIAGNOSIS — Z87891 Personal history of nicotine dependence: Secondary | ICD-10-CM | POA: Insufficient documentation

## 2021-01-15 LAB — CBC WITH DIFFERENTIAL/PLATELET
Abs Immature Granulocytes: 0.02 10*3/uL (ref 0.00–0.07)
Basophils Absolute: 0.1 10*3/uL (ref 0.0–0.1)
Basophils Relative: 1 %
Eosinophils Absolute: 0 10*3/uL (ref 0.0–0.5)
Eosinophils Relative: 0 %
HCT: 38.3 % — ABNORMAL LOW (ref 39.0–52.0)
Hemoglobin: 12.6 g/dL — ABNORMAL LOW (ref 13.0–17.0)
Immature Granulocytes: 0 %
Lymphocytes Relative: 24 %
Lymphs Abs: 1.9 10*3/uL (ref 0.7–4.0)
MCH: 30.5 pg (ref 26.0–34.0)
MCHC: 32.9 g/dL (ref 30.0–36.0)
MCV: 92.7 fL (ref 80.0–100.0)
Monocytes Absolute: 0.6 10*3/uL (ref 0.1–1.0)
Monocytes Relative: 7 %
Neutro Abs: 5.3 10*3/uL (ref 1.7–7.7)
Neutrophils Relative %: 68 %
Platelets: 297 10*3/uL (ref 150–400)
RBC: 4.13 MIL/uL — ABNORMAL LOW (ref 4.22–5.81)
RDW: 13.9 % (ref 11.5–15.5)
WBC: 7.8 10*3/uL (ref 4.0–10.5)
nRBC: 0 % (ref 0.0–0.2)

## 2021-01-15 LAB — BASIC METABOLIC PANEL
Anion gap: 15 (ref 5–15)
BUN: 10 mg/dL (ref 6–20)
CO2: 21 mmol/L — ABNORMAL LOW (ref 22–32)
Calcium: 9 mg/dL (ref 8.9–10.3)
Chloride: 101 mmol/L (ref 98–111)
Creatinine, Ser: 0.89 mg/dL (ref 0.61–1.24)
GFR, Estimated: >60 mL/min (ref >60)
Glucose, Bld: 96 mg/dL (ref 70–99)
Potassium: 3.5 mmol/L (ref 3.5–5.1)
Sodium: 137 mmol/L (ref 135–145)

## 2021-01-15 LAB — TROPONIN I (HIGH SENSITIVITY): Troponin I (High Sensitivity): 10 ng/L (ref <18)

## 2021-01-15 LAB — BRAIN NATRIURETIC PEPTIDE: B Natriuretic Peptide: 21.5 pg/mL (ref 0.0–100.0)

## 2021-01-15 NOTE — ED Triage Notes (Signed)
Per EMS pt from home.  Hx of CHF.  Increased shob and work of breathing.  98% on 2L  Increased work of breathing with EMS.  Increased extremity swelling.  0.4 nitro given en route.  No chest pain.

## 2021-01-15 NOTE — ED Provider Notes (Signed)
Emergency Medicine Provider Triage Evaluation Note  Shaun Stokes , a 55 y.o. male  was evaluated in triage.  Pt complains of shortness of breath.  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  He has a history of CHF.  He has been noticing worsening dyspnea over the past couple of days and it just acutely worsened today.  He has no chest pain.  He also has increased lower extremity swelling bilaterally.  Review of Systems  Positive: Leg swelling, shortness of breath Negative:   Physical Exam  There were no vitals taken for this visit. Gen:   Awake, no distress   Resp:  Dyspnea at rest MSK:   Moves extremities without difficulty  Other:  Bilateral pitting edema of lower extremities  Medical Decision Making  Medically screening exam initiated at 7:52 PM.  Appropriate orders placed.  Shaun Stokes was informed that the remainder of the evaluation will be completed by another provider, this initial triage assessment does not replace that evaluation, and the importance of remaining in the ED until their evaluation is complete.    Shaun Stokes 01/15/21 1953    Carmin Muskrat, MD 01/15/21 2030

## 2021-01-16 ENCOUNTER — Encounter (HOSPITAL_COMMUNITY): Payer: Self-pay

## 2021-01-16 ENCOUNTER — Emergency Department (HOSPITAL_COMMUNITY): Payer: Self-pay

## 2021-01-16 LAB — HEPATIC FUNCTION PANEL
ALT: 36 U/L (ref 0–44)
AST: 44 U/L — ABNORMAL HIGH (ref 15–41)
Albumin: 3.2 g/dL — ABNORMAL LOW (ref 3.5–5.0)
Alkaline Phosphatase: 70 U/L (ref 38–126)
Bilirubin, Direct: 0.2 mg/dL (ref 0.0–0.2)
Indirect Bilirubin: 1.1 mg/dL — ABNORMAL HIGH (ref 0.3–0.9)
Total Bilirubin: 1.3 mg/dL — ABNORMAL HIGH (ref 0.3–1.2)
Total Protein: 7 g/dL (ref 6.5–8.1)

## 2021-01-16 LAB — LIPASE, BLOOD: Lipase: 36 U/L (ref 11–51)

## 2021-01-16 LAB — PROTIME-INR
INR: 1 (ref 0.8–1.2)
Prothrombin Time: 12.9 seconds (ref 11.4–15.2)

## 2021-01-16 LAB — TROPONIN I (HIGH SENSITIVITY): Troponin I (High Sensitivity): 11 ng/L (ref <18)

## 2021-01-16 MED ORDER — FUROSEMIDE 20 MG PO TABS
40.0000 mg | ORAL_TABLET | Freq: Once | ORAL | Status: AC
  Administered 2021-01-16: 40 mg via ORAL
  Filled 2021-01-16: qty 2

## 2021-01-16 MED ORDER — LOSARTAN POTASSIUM 50 MG PO TABS
25.0000 mg | ORAL_TABLET | Freq: Once | ORAL | Status: AC
  Administered 2021-01-16: 25 mg via ORAL
  Filled 2021-01-16: qty 1

## 2021-01-16 MED ORDER — CARVEDILOL 3.125 MG PO TABS
6.2500 mg | ORAL_TABLET | Freq: Once | ORAL | Status: AC
  Administered 2021-01-16: 6.25 mg via ORAL
  Filled 2021-01-16: qty 2

## 2021-01-16 MED ORDER — IOHEXOL 350 MG/ML SOLN
100.0000 mL | Freq: Once | INTRAVENOUS | Status: AC
  Administered 2021-01-16: 100 mL via INTRAVENOUS

## 2021-01-16 NOTE — Discharge Instructions (Signed)
Please follow up with the family doctor in the office.  There are many causes of shortness of breath.  No obvious signs of heart failure, pneumonia, blood clot in the lung or heart attack at this visit.  This does not mean that nothing is wrong, please return for worsening symptoms.

## 2021-01-16 NOTE — ED Notes (Signed)
Patient given discharge instructions. Questions were answered. Patient verbalized understanding of discharge instructions and care at home.  

## 2021-01-16 NOTE — ED Provider Notes (Signed)
[facility] EMERGENCY DEPARTMENT Provider Note   CSN: [id number] Arrival date & time: 01/15/21  1947     History Chief Complaint  Patient presents with   Shortness of Breath    Shaun Stokes is a 55 y.o. male.  Patient presents to the emergency department for evaluation of difficulty breathing.  Patient reports that he has been experiencing severe shortness of breath.  He cannot lie flat to sleep because it causes him to be short of breath, needs to sit up.  He wakes up feeling very short of breath.  Patient reports that symptoms began in the last 1-2 days.  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  No associated chest pain.  He does report chronic lower extremity edema but his legs are more swollen than they usually are.  Patient also reports that he is experiencing abdominal distention.  He does not have any nausea, vomiting, diarrhea.  He has been having normal bowel movements.  He denies associated abdominal pain and fever.      Past Medical History:  Diagnosis Date   Alcohol abuse    Anxiety    Chest pain    Dizziness    r/t anxiety, alcohol withdrawal   Hyperlipemia    Hypertension    Insomnia    Obesity    OSA (obstructive sleep apnea) 08/24/2014   Prediabetes    Vitamin D deficiency     Patient Active Problem List   Diagnosis Date Noted   Alcohol abuse with alcohol-induced mood disorder ([place]) 12/14/2017   Essential hypertension 04/27/2015   Dyspnea 04/26/2015   OSA (obstructive sleep apnea) 08/24/2014   Streptococcal sore throat 06/04/2014   Pharyngitis 06/02/2014   Leukocytosis 06/02/2014   Severe obesity (BMI >= 40) ([place]) 06/02/2014   Pharyngitis, acute 06/02/2014   Prediabetes    Vitamin D deficiency     Past Surgical History:  Procedure Laterality Date   COLONOSCOPY WITH PROPOFOL N/A 09/16/2015   Procedure: COLONOSCOPY WITH  PROPOFOL;  Surgeon: Carol Ada, MD;  Location: [facility];  Service: Endoscopy;  Laterality: N/A;   COLONOSCOPY WITH PROPOFOL N/A 05/13/2020   Procedure: COLONOSCOPY WITH PROPOFOL;  Surgeon: Carol Ada, MD;  Location: [facility];  Service: Endoscopy;  Laterality: N/A;   NO PAST SURGERIES         Family History  Problem Relation Age of Onset   Heart attack Father     Social History   Tobacco Use   Smoking status: Former    Packs/day: 1.00    Years: 32.00    Pack years: 32.00    Types: Cigarettes    Quit date: 11/18/2014    Years since quitting: 6.1   Smokeless tobacco: Never  Vaping Use   Vaping Use: Never used  Substance Use Topics   Alcohol use: Yes    Comment: a fifth a day sometimes a pint   Drug use: No    Home Medications Prior to Admission medications   Medication Sig Start Date End Date Taking? Authorizing Provider  aspirin EC 81 MG tablet Take 81 mg by mouth daily.    [provider]  atorvastatin (LIPITOR) 10 MG tablet Take 10 mg by mouth daily. 03/04/20   [provider]  carvedilol (COREG) 6.25 MG tablet Take 6.25 mg by mouth 2 (two) times daily. 08/23/15   [provider]  furosemide (  LASIX) 40 MG tablet Take 40 mg by mouth daily. 08/23/15   [provider]  gabapentin (NEURONTIN) 300 MG capsule Take 1 capsule (300 mg total) by mouth 3 (three) times daily. 12/14/17   Patrecia Pour, NP  losartan (COZAAR) 25 MG tablet Take 1 tablet (25 mg total) by mouth daily. Patient taking differently: Take 20 mg by mouth daily. 12/15/17   Patrecia Pour, NP  trazodone (DESYREL) 300 MG tablet Take 300 mg by mouth at bedtime.    [provider]    Allergies    Patient has no known allergies.  Review of Systems   Review of Systems  Respiratory:  Positive for chest tightness and shortness of breath.   Cardiovascular:  Positive for leg swelling.  Gastrointestinal:  Positive for abdominal distention.  All other systems  reviewed and are negative.  Physical Exam Updated Vital Signs BP (!) 170/102 (BP Location: Left Arm)   Pulse (!) 111   Temp 97.7 F (36.5 C)   Resp 20   SpO2 100%   Physical Exam Vitals and nursing note reviewed.  Constitutional:      General: He is not in acute distress.    Appearance: Normal appearance. He is well-developed.  HENT:     Head: Normocephalic and atraumatic.     Right Ear: Hearing normal.     Left Ear: Hearing normal.     Nose: Nose normal.  Eyes:     Conjunctiva/sclera: Conjunctivae normal.     Pupils: Pupils are equal, round, and reactive to light.  Cardiovascular:     Rate and Rhythm: Regular rhythm.     Heart sounds: S1 normal and S2 normal. No murmur heard.   No friction rub. No gallop.  Pulmonary:     Effort: Pulmonary effort is normal. No respiratory distress.     Breath sounds: Decreased breath sounds present.  Chest:     Chest wall: No tenderness.  Abdominal:     General: Abdomen is protuberant. Bowel sounds are normal.     Palpations: Abdomen is soft.     Tenderness: There is no abdominal tenderness. There is no guarding or rebound. Negative signs include Murphy's sign and McBurney's sign.     Hernia: No hernia is present.  Musculoskeletal:        General: Normal range of motion.     Cervical back: Normal range of motion and neck supple.     Right lower leg: 3+ Pitting Edema present.     Left lower leg: 3+ Pitting Edema present.  Skin:    General: Skin is warm and dry.     Findings: No rash.  Neurological:     Mental Status: He is alert and oriented to person, place, and time.     GCS: GCS eye subscore is 4. GCS verbal subscore is 5. GCS motor subscore is 6.     Cranial Nerves: No cranial nerve deficit.     Sensory: No sensory deficit.     Coordination: Coordination normal.  Psychiatric:        Speech: Speech normal.        Behavior: Behavior normal.        Thought Content: Thought content normal.    ED Results / Procedures /  Treatments   Labs (all labs ordered are listed, but only abnormal results are displayed) Labs Reviewed  BASIC METABOLIC PANEL - Abnormal; Notable for the following components:      Result Value   CO2 21 (*)  All other components within normal limits  CBC WITH DIFFERENTIAL/PLATELET - Abnormal; Notable for the following components:   RBC 4.13 (*)    Hemoglobin 12.6 (*)    HCT 38.3 (*)    All other components within normal limits  BRAIN NATRIURETIC PEPTIDE  PROTIME-INR  HEPATIC FUNCTION PANEL  LIPASE, BLOOD  AMMONIA  URINALYSIS, ROUTINE W REFLEX MICROSCOPIC  TROPONIN I (HIGH SENSITIVITY)  TROPONIN I (HIGH SENSITIVITY)    EKG None  Radiology DG Chest 2 View  Result Date: 01/15/2021 CLINICAL DATA:  Dyspnea EXAM: CHEST - 2 VIEW COMPARISON:  December 2020 FINDINGS: Atelectasis/scarring at the lung bases. No pleural effusion. No pneumothorax. Similar cardiomediastinal contours with normal heart size. No acute osseous abnormality. IMPRESSION: Atelectasis/scarring at the lung bases. Electronically Signed   By: Macy Mis M.D.   On: 01/15/2021 20:32    Procedures Procedures   Medications Ordered in ED Medications - No data to display  ED Course  I have reviewed the triage vital signs and the nursing notes.  Pertinent labs & imaging results that were available during my care of the patient were reviewed by me and considered in my medical decision making (see chart for details).    MDM Rules/Calculators/A&P                           Patient presents to the emergency department for evaluation of shortness of breath.  Patient reports a change in his exercise tolerance approximately 2 days ago.  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  Since then he has noticed inability to lie flat because of increased shortness of breath, increased swelling of his legs.  No associated chest pain.  Patient noted to be tachycardic at rest  here without fever.  Chest x-ray was clear.  Troponins are normal.  EKG without ischemic changes.  BNP is not elevated.  Clinically does look like he is likely volume overloaded.  He also also, however, complaining of abdominal distention.  His records do indicate a history of alcohol abuse.  We will therefore add on hepatic panel, pro time to evaluate for possible liver disease.  He will undergo CT angiography of chest to rule out PE as a cause of his shortness of breath.  We will continue the scan down through the abdomen to further evaluate his abdominal distention.  Will sign out to oncoming ER physician to follow-up on CT scans.  Final Clinical Impression(s) / ED Diagnoses Final diagnoses:  Shortness of breath    Rx / DC Orders ED Discharge Orders     None        Jazzlynn Rawe, Gwenyth Allegra, MD 01/16/21 [phone]

## 2021-01-16 NOTE — ED Notes (Signed)
Oxygen saturations ranged from 95%-100% with ambulation on room air.

## 2021-01-16 NOTE — ED Provider Notes (Signed)
I see the patient in signout from Dr. Mariane Baumgarten, briefly the patient is a 55 year old male with a chief complaints of shortness of breath on exertion and orthopnea and lower extremity edema.  Work-up here without obvious signs of heart failure.  Chest x-ray viewed by me without obvious fluid overload.  Awaiting CT scan of the chest abdomen pelvis.  These have resulted and also do not fully describe the patient's issue.  He is able to ambulate here without hypoxia.  He is tachycardic persistently here, looking at his prior notes it seems that this is not an uncommon problem for him.  Has had thyroid studies previously that were unremarkable.  I feel he would most likely benefit from a outpatient work-up.  Suggest that he call his family doctor today to try and set up an appointment.  We will have him return for worsening, chest pain.   Deno Etienne, DO 01/16/21 [phone]

## 2021-01-19 ENCOUNTER — Telehealth: Payer: Self-pay

## 2021-01-19 NOTE — Telephone Encounter (Signed)
NOTES SCANNED TO REFERRAL 

## 2021-02-21 ENCOUNTER — Other Ambulatory Visit: Payer: Self-pay

## 2021-02-21 ENCOUNTER — Encounter: Payer: Self-pay | Admitting: Pulmonary Disease

## 2021-02-21 ENCOUNTER — Ambulatory Visit (INDEPENDENT_AMBULATORY_CARE_PROVIDER_SITE_OTHER): Payer: Self-pay | Admitting: Pulmonary Disease

## 2021-02-21 VITALS — BP 118/72 | HR 104 | Temp 98.0°F | Ht 73.0 in | Wt 390.0 lb

## 2021-02-21 DIAGNOSIS — G4733 Obstructive sleep apnea (adult) (pediatric): Secondary | ICD-10-CM

## 2021-02-21 MED ORDER — ZOLPIDEM TARTRATE 10 MG PO TABS: 10.0000 mg | ORAL_TABLET | Freq: Every evening | ORAL | 3 refills | Status: AC | PRN

## 2021-02-21 NOTE — Patient Instructions (Signed)
We will schedule him for home sleep study  Ambien for insomnia  Will see you in about 3 to 4 months  Continue weight loss efforts  Call with significant   Sleep Apnea Sleep apnea affects breathing during sleep. It causes breathing to stop for 10 seconds or more, or to become shallow. People with sleep apnea usually snore loudly. It can also increase the risk of: Heart attack. Stroke. Being very overweight (obese). Diabetes. Heart failure. Irregular heartbeat. High blood pressure. The goal of treatment is to help you breathe normally again. What are the causes? The most common cause of this condition is a collapsed or blocked airway. There are three kinds of sleep apnea: Obstructive sleep apnea. This is caused by a blocked or collapsed airway. Central sleep apnea. This happens when the brain does not send the right signals to the muscles that control breathing. Mixed sleep apnea. This is a combination of obstructive and central sleep apnea. What increases the risk? Being overweight. Smoking. Having a small airway. Being older. Being male. Drinking alcohol. Taking medicines to calm yourself (sedatives or tranquilizers). Having family members with the condition. Having a tongue or tonsils that are larger than normal. What are the signs or symptoms? Trouble staying asleep. Loud snoring. Headaches in the morning. Waking up gasping. Dry mouth or sore throat in the morning. Being sleepy or tired during the day. If you are sleepy or tired during the day, you may also: Not be able to focus your mind (concentrate). Forget things. Get angry a lot and have mood swings. Feel sad (depressed). Have changes in your personality. Have less interest in sex, if you are male. Be unable to have an erection, if you are male. How is this treated?  Sleeping on your side. Using a medicine to get rid of mucus in your nose (decongestant). Avoiding the use of alcohol, medicines to help  you relax, or certain pain medicines (narcotics). Losing weight, if needed. Changing your diet. Quitting smoking. Using a machine to open your airway while you sleep, such as: An oral appliance. This is a mouthpiece that shifts your lower jaw forward. A CPAP device. This device blows air through a mask when you breathe out (exhale). An EPAP device. This has valves that you put in each nostril. A BIPAP device. This device blows air through a mask when you breathe in (inhale) and breathe out. Having surgery if other treatments do not work. Follow these instructions at home: Lifestyle Make changes that your doctor recommends. Eat a healthy diet. Lose weight if needed. Avoid alcohol, medicines to help you relax, and some pain medicines. Do not smoke or use any products that contain nicotine or tobacco. If you need help quitting, ask your doctor. General instructions Take over-the-counter and prescription medicines only as told by your doctor. If you were given a machine to use while you sleep, use it only as told by your doctor. If you are having surgery, make sure to tell your doctor you have sleep apnea. You may need to bring your device with you. Keep all follow-up visits. Contact a doctor if: The machine that you were given to use during sleep bothers you or does not seem to be working. You do not get better. You get worse. Get help right away if: Your chest hurts. You have trouble breathing in enough air. You have an uncomfortable feeling in your back, arms, or stomach. You have trouble talking. One side of your body feels weak. A part  of your face is hanging down. These symptoms may be an emergency. Get help right away. Call your local emergency services (911 in the [place] Do not wait to see if the symptoms will go away. Do not drive yourself to the hospital. Summary This condition affects breathing during sleep. The most common cause is a collapsed or blocked airway. The  goal of treatment is to help you breathe normally while you sleep. This information is not intended to replace advice given to you by your health care provider. Make sure you discuss any questions you have with your health care provider. Document Revised: 09/07/2020 Document Reviewed: 01/08/2020 Elsevier Patient Education  2022 [employer].

## 2021-02-21 NOTE — Progress Notes (Signed)
Chief Complaint  Patient presents with   New Patient (Initial Visit)    Dyspnea   History of Present Illness: 56 yo male with history of alcohol abuse, former tobacco abuse anxiety, depression, diabetes, prior tobacco abuse, HTN, hyperlipidemia, anemia, sleep apnea who is here today as a new consult, referred by Dr. Betsey Holiday, for the evaluation of dyspnea. He was seen in the ED at [facility] 01/16/21 with c/o shortness of breath. BNP normal. Troponin negative x 2. EKG with sinus with poor R wave progression. Chest CTA with no PE. No evidence of CHF. Aortic atherosclerosis noted on CT. He was not felt to be volume overloaded and was discharged home. He has sleep apnea and has not been using his CPAP. He has a pending sleep study. He says he has been on Lasix for congestive heart failure. No FH of CAD. His father had sarcoidosis.   He tells me today that he has ongoing dyspnea. He is up 100 lbs over the past year per his report. Chronic LE edema. The patient denies any chest pain, palpitations, orthopnea, PND, dizziness, near syncope or syncope. 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   Primary Care Physician: Benito Mccreedy, MD   Past Medical History:  Diagnosis Date   Alcohol abuse    Alcohol abuse    Anxiety    Chest pain    Depression    Diabetes ([place])    Diabetic polyneuropathy ([place])    Dizziness    r/t anxiety, alcohol withdrawal   History of tobacco abuse    Hyperlipemia    Hypertension    Insomnia    Normocytic anemia    Obesity    OSA (obstructive sleep apnea) 08/24/2014   Prediabetes    Vitamin D deficiency     Past Surgical History:  Procedure Laterality Date   COLONOSCOPY WITH PROPOFOL N/A 09/16/2015   Procedure: COLONOSCOPY WITH PROPOFOL;  Surgeon: Carol Ada, MD;  Location: [facility] ENDOSCOPY;  Service: Endoscopy;  Laterality: N/A;   COLONOSCOPY WITH PROPOFOL N/A 05/13/2020   Procedure: COLONOSCOPY WITH PROPOFOL;  Surgeon: Carol Ada, MD;  Location: [facility]  ENDOSCOPY;  Service: Endoscopy;  Laterality: N/A;   NO PAST SURGERIES      Current Outpatient Medications  Medication Sig Dispense Refill   aspirin EC 81 MG tablet Take 81 mg by mouth daily.     atorvastatin (LIPITOR) 10 MG tablet Take 10 mg by mouth daily.     carvedilol (COREG) 6.25 MG tablet Take 6.25 mg by mouth 2 (two) times daily.  0   furosemide (LASIX) 40 MG tablet Take 40 mg by mouth daily.  0   gabapentin (NEURONTIN) 300 MG capsule Take 1 capsule (300 mg total) by mouth 3 (three) times daily. 90 capsule 0   HYDROXYZINE PAMOATE PO Take 25 mg by mouth.     losartan (COZAAR) 25 MG tablet Take 1 tablet (25 mg total) by mouth daily. 30 tablet 0   meloxicam (MOBIC) 7.5 MG tablet Take 7.5 mg by mouth daily.     trazodone (DESYREL) 300 MG tablet Take 300 mg by mouth at bedtime.     zolpidem (AMBIEN) 10 MG tablet Take 1 tablet (10 mg total) by mouth at bedtime as needed for sleep. 30 tablet 3   No current facility-administered medications for this visit.    No Known Allergies  Social History   Socioeconomic History   Marital status: Single    Spouse name: Not on file   Number  of children: Not on file   Years of education: Not on file   Highest education level: Not on file  Occupational History   Occupation: Housekeeping  Tobacco Use   Smoking status: Former    Packs/day: 1.00    Years: 32.00    Pack years: 32.00    Types: Cigarettes    Quit date: 11/18/2014    Years since quitting: 6.2   Smokeless tobacco: Never  Vaping Use   Vaping Use: Never used  Substance and Sexual Activity   Alcohol use: Yes    Comment: couple beers per day   Drug use: No   Sexual activity: Yes  Other Topics Concern   Not on file  Social History Narrative   Not on file   Social Determinants of Health   Financial Resource Strain: Not on file  Food Insecurity: Not on file  Transportation Needs: Not on file  Physical Activity: Not on file  Stress: Not on file  Social Connections: Not on  file  Intimate Partner Violence: Not on file    Family History  Problem Relation Age of Onset   Sarcoidosis Father     Review of Systems:  As stated in the HPI and otherwise negative.   BP 130/76    Pulse 94    Ht 6\' 1"  (1.854 m)    Wt (!) 390 lb 12.8 oz (177.3 kg)    SpO2 98%    BMI 51.56 kg/m   Physical Examination: General: Well developed, well nourished, NAD  HEENT: OP clear, mucus membranes moist  SKIN: warm, dry. No rashes. Neuro: No focal deficits  Musculoskeletal: Muscle strength 5/5 all ext  Psychiatric: Mood and affect normal  Neck: No JVD, no carotid bruits, no thyromegaly, no lymphadenopathy.  Lungs:Clear bilaterally, no wheezes, rhonci, crackles Cardiovascular: Regular rate and rhythm. No murmurs, gallops or rubs. Abdomen:Soft. Bowel sounds present. Non-tender.  Extremities: 1-2 + bilateral LE edema.   EKG:  EKG is ordered today. The ekg ordered today demonstrates sinus  Recent Labs: 01/15/2021: B Natriuretic Peptide 21.5; BUN 10; Creatinine, Ser 0.89; Hemoglobin 12.6; Platelets 297; Potassium 3.5; Sodium 137 01/16/2021: ALT 36   Lipid Panel No results found for: CHOL, TRIG, HDL, CHOLHDL, VLDL, LDLCALC, LDLDIRECT   Wt Readings from Last 3 Encounters:  02/22/21 (!) 390 lb 12.8 oz (177.3 kg)  02/21/21 (!) 390 lb (176.9 kg)  05/13/20 (!) 360 lb 0.2 oz (163.3 kg)     Assessment and Plan:   1. Dyspnea: He is morbidly obese. His dyspnea is likely multi-factorial secondary to his obesity, sleep apnea and diastolic CHF. He has had no prior echocardiogram. BNP was not elevated one month ago in the ED. -Will arrange an echo to assess LVEF, exclude structural heart disease -Continue Lasix. Will likely titrate this up at follow up. Normal renal function by labs in December 2022.  -He is seeing pulmonary and has a pending sleep study -He will need to focus on diet and weight loss  2. Aortic atherosclerosis: Continue statin and ASA  3. Morbid obesity: See  above    Disposition:   F/U with me or office APP on my care team in 4-6 weeks.    Signed, Lauree Chandler, MD 02/22/2021 9:28 AM    [address], [place], [place]  [address] Phone: [phone]; Fax: [phone]

## 2021-02-21 NOTE — Progress Notes (Signed)
Shaun Stokes    [id number]    February 15, 1965  Primary Care Physician:Osei-Bonsu, Iona Beard, MD  Referring Physician: Benito Mccreedy, MD [place],  [place] [address]  Chief complaint:   Past history of obstructive sleep apnea  HPI:  Diagnosed with obstructive sleep apnea 2016 Use CPAP for many years, machine stopped working about 2 years ago  History of snoring, witnessed apneas  Previous study did reveal severe sleep apnea with an AHI of over 60  Usually tries to go to bed about 11 PM As always had chronic problems trying to sleep Hours tried trazodone and hydroxyzine which do not really help much but gets him some sleep  If he does not take any medications he will not be able to sleep at all  Did not have any significant problems tolerating CPAP in the past  History of hypertension, history of heart failure, history of hypercholesterolemia  Reformed smoker quit about 7 years ago   Outpatient Encounter Medications as of 02/21/2021  Medication Sig   aspirin EC 81 MG tablet Take 81 mg by mouth daily.   atorvastatin (LIPITOR) 10 MG tablet Take 10 mg by mouth daily.   carvedilol (COREG) 6.25 MG tablet Take 6.25 mg by mouth 2 (two) times daily.   furosemide (LASIX) 40 MG tablet Take 40 mg by mouth daily.   gabapentin (NEURONTIN) 300 MG capsule Take 1 capsule (300 mg total) by mouth 3 (three) times daily.   HYDROXYZINE PAMOATE PO Take 25 mg by mouth.   losartan (COZAAR) 25 MG tablet Take 1 tablet (25 mg total) by mouth daily. (Patient taking differently: Take 20 mg by mouth daily.)   meloxicam (MOBIC) 7.5 MG tablet Take 7.5 mg by mouth daily.   trazodone (DESYREL) 300 MG tablet Take 300 mg by mouth at bedtime.   No facility-administered encounter medications on file as of 02/21/2021.    Allergies as of 02/21/2021   (No Known Allergies)    Past Medical History:  Diagnosis Date   Alcohol abuse    Alcohol abuse    Anxiety    Chest pain     Depression    Diabetes ([place])    Diabetic polyneuropathy ([place])    Dizziness    r/t anxiety, alcohol withdrawal   History of tobacco abuse    Hyperlipemia    Hypertension    Insomnia    Normocytic anemia    Obesity    OSA (obstructive sleep apnea) 08/24/2014   Prediabetes    Vitamin D deficiency     Past Surgical History:  Procedure Laterality Date   COLONOSCOPY WITH PROPOFOL N/A 09/16/2015   Procedure: COLONOSCOPY WITH PROPOFOL;  Surgeon: Carol Ada, MD;  [facility];  Service: Endoscopy;  Laterality: N/A;   COLONOSCOPY WITH PROPOFOL N/A 05/13/2020   Procedure: COLONOSCOPY WITH PROPOFOL;  Surgeon: Carol Ada, MD;  [facility];  Service: Endoscopy;  Laterality: N/A;   NO PAST SURGERIES      Family History  Problem Relation Age of Onset   Heart attack Father     Social History   Socioeconomic History   Marital status: Single    Spouse name: Not on file   Number of children: Not on file   Years of education: Not on file   Highest education level: Not on file  Occupational History   Occupation: Housekeeping  Tobacco Use   Smoking status: Former    Packs/day: 1.00  Years: 32.00    Pack years: 32.00    Types: Cigarettes    Quit date: 11/18/2014    Years since quitting: 6.2   Smokeless tobacco: Never  Vaping Use   Vaping Use: Never used  Substance and Sexual Activity   Alcohol use: Yes    Comment: a fifth a day sometimes a pint   Drug use: No   Sexual activity: Yes  Other Topics Concern   Not on file  Social History Narrative   Not on file   Social Determinants of Health   Financial Resource Strain: Not on file  Food Insecurity: Not on file  Transportation Needs: Not on file  Physical Activity: Not on file  Stress: Not on file  Social Connections: Not on file  Intimate Partner Violence: Not on file    Review of Systems  Constitutional:  Positive for fatigue.  Psychiatric/Behavioral:  Positive for sleep disturbance.    Vitals:    02/21/21 1447  BP: 118/72  Pulse: (!) 104  Temp: 98 F (36.7 C)  SpO2: 98%     Physical Exam Constitutional:      Appearance: He is obese.  HENT:     Head: Normocephalic.     Nose: Nose normal. No congestion.     Mouth/Throat:     Mouth: Mucous membranes are moist.     Comments: Mallampati 4, crowded oropharynx Cardiovascular:     Rate and Rhythm: Normal rate and regular rhythm.     Heart sounds: No murmur heard.   No friction rub.  Pulmonary:     Effort: No respiratory distress.     Breath sounds: No stridor. No wheezing or rhonchi.  Musculoskeletal:     Cervical back: No rigidity or tenderness.  Neurological:     Mental Status: He is alert.  Psychiatric:        Mood and Affect: Mood normal.   Results of the Epworth flowsheet 02/21/2021  Sitting and reading 1  Watching TV 1  Sitting, inactive in a public place (e.g. a theatre or a meeting) 0  As a passenger in a car for an hour without a break 2  Lying down to rest in the afternoon when circumstances permit 1  Sitting and talking to someone 1  Sitting quietly after a lunch without alcohol 0  In a car, while stopped for a few minutes in traffic 0  Total score 6     Data Reviewed: Previous sleep study reviewed showing severe obstructive sleep apnea  Assessment:  History of severe obstructive sleep apnea  Sleep onset and sleep maintenance insomnia  Morbid obesity  Daytime fatigue    Plan/Recommendations: We will schedule him for home sleep study  [place] in for chronic insomnia  Encouraged weight loss efforts  Tentative follow-up in 3 to 4 months  Encouraged to call with any significant concerns   Sherrilyn Rist MD [facility] 02/21/2021, 2:56 PM  CC: Benito Mccreedy, MD

## 2021-02-22 ENCOUNTER — Ambulatory Visit: Payer: Self-pay | Admitting: Cardiovascular Disease

## 2021-02-22 ENCOUNTER — Encounter: Payer: Self-pay | Admitting: Cardiovascular Disease

## 2021-02-22 VITALS — BP 130/76 | HR 94 | Ht 73.0 in | Wt 390.8 lb

## 2021-02-22 DIAGNOSIS — R0602 Shortness of breath: Secondary | ICD-10-CM

## 2021-02-22 DIAGNOSIS — I700 Atherosclerosis of aorta: Secondary | ICD-10-CM

## 2021-02-22 NOTE — Patient Instructions (Signed)
Medication Instructions:  Your physician recommends that you continue on your current medications as directed. Please refer to the Current Medication list given to you today.  *If you need a refill on your cardiac medications before your next appointment, please call your pharmacy*   Lab Work: None today If you have labs (blood work) drawn today and your tests are completely normal, you will receive your results only by: [facility] (if you have MyChart) OR A paper copy in the mail If you have any lab test that is abnormal or we need to change your treatment, we will call you to review the results.   Testing/Procedures: Your physician has requested that you have an echocardiogram. Echocardiography is a painless test that uses sound waves to create images of your heart. It provides your doctor with information about the size and shape of your heart and how well your hearts chambers and valves are working. This procedure takes approximately one hour. There are no restrictions for this procedure.    Follow-Up: At [facility], you and your health needs are our priority.  As part of our continuing mission to provide you with exceptional heart care, we have created designated Provider Care Teams.  These Care Teams include your primary Cardiologist (physician) and Advanced Practice Providers (APPs -  Physician Assistants and Nurse Practitioners) who all work together to provide you with the care you need, when you need it.  We recommend signing up for the patient portal called "MyChart".  Sign up information is provided on this After Visit Summary.  MyChart is used to connect with patients for Virtual Visits (Telemedicine).  Patients are able to view lab/test results, encounter notes, upcoming appointments, etc.  Non-urgent messages can be sent to your provider as well.   To learn more about what you can do with MyChart, go to [email or web address].    Your next appointment:   4 -8  week(s)  The format for your next appointment:   In Person  Ermalinda Barrios PA-C, Sharrell Ku PA-C, or other available APP.

## 2021-03-09 ENCOUNTER — Ambulatory Visit (HOSPITAL_COMMUNITY): Payer: Self-pay | Attending: Cardiology

## 2021-03-09 ENCOUNTER — Other Ambulatory Visit: Payer: Self-pay

## 2021-03-09 DIAGNOSIS — I700 Atherosclerosis of aorta: Secondary | ICD-10-CM | POA: Insufficient documentation

## 2021-03-09 DIAGNOSIS — R0602 Shortness of breath: Secondary | ICD-10-CM | POA: Insufficient documentation

## 2021-03-09 LAB — ECHOCARDIOGRAM COMPLETE
Area-P 1/2: 8.72 cm2
MV M vel: 5.51 m/s
MV Peak grad: 121.4 mmHg
S' Lateral: 3 cm

## 2021-03-09 MED ORDER — PERFLUTREN LIPID MICROSPHERE
1.0000 mL | INTRAVENOUS | Status: AC | PRN
  Administered 2021-03-09: 3 mL via INTRAVENOUS

## 2021-03-20 NOTE — Progress Notes (Unsigned)
Cardiology Office Note    Date:  03/20/2021   ID:  Shaun Stokes, Shaun Stokes 05-22-1965, MRN [id number]   PCP:  Benito Mccreedy, MD   [place]  Cardiologist:  None   Advanced Practice Provider:  No care team member to display Electrophysiologist:  None   [id number]}   No chief complaint on file.   History of Present Illness:  Shaun Stokes is a 56 y.o. male with history of alcohol abuse, former tobacco abuse anxiety, depression, diabetes, prior tobacco abuse, HTN, hyperlipidemia, anemia, sleep apnea.  Patient saw Dr. Angelena Form 02/22/2021 after ER visit in December for shortness of breath.  BNP normal troponins negative x2 EKG with poor R wave progression CTA no PE or evidence of CHF.  Aortic atherosclerosis noted on CT.  He was not using his CPAP.  He is scheduled for another sleep study.  He was tased getting Lasix for congestive heart failure and had gained 100 pounds in the past year per his report.  Dyspnea felt to be multifactorial secondary to his obesity sleep apnea and possibly diastolic CHF.  2D echo 03/09/2021 normal LVEF 70 to 75% with grade 1 DD, mild to moderate MR although difficult to assess because of suboptimal study.  No medicine changes made.  Patient had follow-up with pulmonary, pending sleep study.  He needs to focus on diet and weight loss.   Past Medical History:  Diagnosis Date   Alcohol abuse    Alcohol abuse    Anxiety    Chest pain    Depression    Diabetes ([place])    Diabetic polyneuropathy ([place])    Dizziness    r/t anxiety, alcohol withdrawal   History of tobacco abuse    Hyperlipemia    Hypertension    Insomnia    Normocytic anemia    Obesity    OSA (obstructive sleep apnea) 08/24/2014   Prediabetes    Vitamin D deficiency     Past Surgical History:  Procedure Laterality Date   COLONOSCOPY WITH PROPOFOL N/A 09/16/2015   Procedure: COLONOSCOPY WITH PROPOFOL;  Surgeon: Carol Ada, MD;  [facility];  Service:  Endoscopy;  Laterality: N/A;   COLONOSCOPY WITH PROPOFOL N/A 05/13/2020   Procedure: COLONOSCOPY WITH PROPOFOL;  Surgeon: Carol Ada, MD;  [facility];  Service: Endoscopy;  Laterality: N/A;   NO PAST SURGERIES      Current Medications: No outpatient medications have been marked as taking for the 03/28/21 encounter (Appointment) with Imogene Burn, PA-C.     Allergies:   Patient has no known allergies.   Social History   Socioeconomic History   Marital status: Single    Spouse name: Not on file   Number of children: Not on file   Years of education: Not on file   Highest education level: Not on file  Occupational History   Occupation: Housekeeping  Tobacco Use   Smoking status: Former    Packs/day: 1.00    Years: 32.00    Pack years: 32.00    Types: Cigarettes    Quit date: 11/18/2014    Years since quitting: 6.3   Smokeless tobacco: Never  Vaping Use   Vaping Use: Never used  Substance and Sexual Activity   Alcohol use: Yes    Comment: couple beers per day   Drug use: No   Sexual activity: Yes  Other Topics Concern   Not on file  Social History Narrative   Not on file  Social Determinants of Health   Financial Resource Strain: Not on file  Food Insecurity: Not on file  Transportation Needs: Not on file  Physical Activity: Not on file  Stress: Not on file  Social Connections: Not on file     Family History:  The patient's ***family history includes Sarcoidosis in his father.   ROS:   Please see the history of present illness.    ROS All other systems reviewed and are negative.   PHYSICAL EXAM:   VS:  There were no vitals taken for this visit.  Physical Exam  GEN: Well nourished, well developed, in no acute distress  HEENT: normal  Neck: no JVD, carotid bruits, or masses Cardiac:RRR; no murmurs, rubs, or gallops  Respiratory:  clear to auscultation bilaterally, normal work of breathing GI: soft, nontender, nondistended, + BS Ext:  without cyanosis, clubbing, or edema, Good distal pulses bilaterally MS: no deformity or atrophy  Skin: warm and dry, no rash Neuro:  Alert and Oriented x 3, Strength and sensation are intact Psych: euthymic mood, full affect  Wt Readings from Last 3 Encounters:  02/22/21 (!) 390 lb 12.8 oz (177.3 kg)  02/21/21 (!) 390 lb (176.9 kg)  05/13/20 (!) 360 lb 0.2 oz (163.3 kg)      Studies/Labs Reviewed:   EKG:  EKG is*** ordered today.  The ekg ordered today demonstrates ***  Recent Labs: 01/15/2021: B Natriuretic Peptide 21.5; BUN 10; Creatinine, Ser 0.89; Hemoglobin 12.6; Platelets 297; Potassium 3.5; Sodium 137 01/16/2021: ALT 36   Lipid Panel No results found for: CHOL, TRIG, HDL, CHOLHDL, VLDL, LDLCALC, LDLDIRECT  Additional studies/ records that were reviewed today include:  2D echo 03/09/2021 IMPRESSIONS     1. Left ventricular ejection fraction, by estimation, is 70 to 75%. The  left ventricle has hyperdynamic function. The left ventricle has no  regional wall motion abnormalities. There is moderte hypertrophy of the  basal-septum. The rest of the LV segments   demonstrate mild left ventricular hypertrophy. Left ventricular diastolic  parameters are consistent with Grade I diastolic dysfunction (impaired  relaxation). An intracavitary gradient is present and there is flow  acceleration noted at the LVOT however  no SAM visualized and suspect the CW doppler captured was the MR signal  and not the LVOT gradient. No evidence of AS.   2. Right ventricular systolic function is normal. The right ventricular  size is normal.   3. The mitral valve is normal in structure. Suspect mild-to-moderate MR,  however, difficult to assess as the valve was interrogated at suboptimal  nyquist limit. Could consider repeat limited study for re-interrogation of  the mitral valve.   4. The aortic valve is tricuspid. There is mild thickening of the aortic  valve. Aortic valve regurgitation is  not visualized. Aortic valve  sclerosis is present, with no evidence of aortic valve stenosis.   Comparison(s): No prior Echocardiogram.    Risk Assessment/Calculations:   {Does this patient have ATRIAL FIBRILLATION?:[phone]}     ASSESSMENT:    No diagnosis found.   PLAN:  In order of problems listed above:  Dyspnea felt to be multifactorial due to morbid obesity sleep apnea and diastolic CHF.  LVEF 70%  Hypertension  Hyperlipidemia  Obesity  OSA  History of alcohol abuse  DM 2  Shared Decision Making/Informed Consent   {Are you ordering a CV Procedure (e.g. stress test, cath, DCCV, TEE, etc)?   Press F2        :[id number]}  Medication Adjustments/Labs and Tests Ordered: Current medicines are reviewed at length with the patient today.  Concerns regarding medicines are outlined above.  Medication changes, Labs and Tests ordered today are listed in the Patient Instructions below. There are no Patient Instructions on file for this visit.   Sumner Boast, PA-C  03/20/2021 10:46 AM    [facility] [place], [place], [place]  [address] Phone: [phone]; Fax: [phone]  ,

## 2021-03-28 ENCOUNTER — Ambulatory Visit: Payer: Self-pay | Admitting: Physician Assistant

## 2021-03-28 DIAGNOSIS — R0609 Other forms of dyspnea: Secondary | ICD-10-CM

## 2021-03-28 DIAGNOSIS — F1014 Alcohol abuse with alcohol-induced mood disorder: Secondary | ICD-10-CM

## 2021-03-28 DIAGNOSIS — E785 Hyperlipidemia, unspecified: Secondary | ICD-10-CM

## 2021-03-28 DIAGNOSIS — G4733 Obstructive sleep apnea (adult) (pediatric): Secondary | ICD-10-CM

## 2021-03-28 DIAGNOSIS — I10 Essential (primary) hypertension: Secondary | ICD-10-CM

## 2021-03-28 DIAGNOSIS — R7303 Prediabetes: Secondary | ICD-10-CM

## 2021-05-14 ENCOUNTER — Other Ambulatory Visit: Payer: Self-pay

## 2021-05-14 DIAGNOSIS — I872 Venous insufficiency (chronic) (peripheral): Secondary | ICD-10-CM

## 2021-05-22 ENCOUNTER — Encounter: Payer: Self-pay | Admitting: Pulmonary Disease

## 2021-05-22 ENCOUNTER — Ambulatory Visit (INDEPENDENT_AMBULATORY_CARE_PROVIDER_SITE_OTHER): Payer: Managed Care, Other (non HMO) | Admitting: Pulmonary Disease

## 2021-05-22 VITALS — BP 128/84 | HR 84 | Temp 97.9°F | Ht 73.0 in | Wt >= 6400 oz

## 2021-05-22 DIAGNOSIS — G4733 Obstructive sleep apnea (adult) (pediatric): Secondary | ICD-10-CM | POA: Diagnosis not present

## 2021-05-22 NOTE — Progress Notes (Signed)
? ? ?Requested by:  ?Benito Mccreedy, MD ?[facility] ?[address] ?[place],  [place] [address] ? ?Reason for consultation: venous insufficiency of RLE with ulcer  ? ? ?History of Present Illness  ? ?Shaun Stokes is a 56 y.o. (05/21/1965) male who presents for evaluation of bilateral lower extremity swelling R> L. He says he has had swelling for several years now. Started out mostly in just his feet but now his legs are swollen as well. This has increased over the past 6 months. He says he does have CHF. He denies any pain in his legs, aching or throbbing. He does report heaviness in his legs. Has had some "water blisters" from time to time. 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 Otherwise he does not really exercise. He says his legs do okay while working but then as soon as he gets home that is when they really swell. He does not elevate his legs and has never worn compression socks. He has no history of DVT. No family history of venous disease.  ? ?Venous symptoms include: swelling, heaviness ?Onset/duration: several years, worse in past 6 months ?Occupation:  Proofreader Delivery ?Aggravating factors: sitting, standing ?Alleviating factors: (elevation) ?Compression:  never Helps:  n/a ?Pain medications:  none ?Previous vein procedures:  no ?History of DVT:  no ? ?Past Medical History:  ?Diagnosis Date  ? Alcohol abuse   ? Alcohol abuse   ? Anxiety   ? Chest pain   ? Depression   ? Diabetes ([place])   ? Diabetic polyneuropathy ([place])   ? Dizziness   ? r/t anxiety, alcohol withdrawal  ? History of tobacco abuse   ? Hyperlipemia   ? Hypertension   ? Insomnia   ? Normocytic anemia   ? Obesity   ? OSA (obstructive sleep apnea) 08/24/2014  ? Prediabetes   ? Vitamin D deficiency   ? ? ?Past Surgical History:  ?Procedure Laterality Date  ? COLONOSCOPY WITH PROPOFOL N/A 09/16/2015  ? Procedure: COLONOSCOPY WITH PROPOFOL;  Surgeon: Carol Ada, MD;  Location: [facility];   Service: Endoscopy;  Laterality: N/A;  ? COLONOSCOPY WITH PROPOFOL N/A 05/13/2020  ? Procedure: COLONOSCOPY WITH PROPOFOL;  Surgeon: Carol Ada, MD;  Location[facility];  Service: Endoscopy;  Laterality: N/A;  ? NO PAST SURGERIES    ? ? ?Social History  ? ?Socioeconomic History  ? Marital status: Single  ?  Spouse name: Not on file  ? Number of children: Not on file  ? Years of education: Not on file  ? Highest education level: Not on file  ?Occupational History  ? Occupation: Housekeeping  ?Tobacco Use  ? Smoking status: Former  ?  Packs/day: 1.00  ?  Years: 32.00  ?  Pack years: 32.00  ?  Types: Cigarettes  ?  Quit date: 11/18/2014  ?  Years since quitting: 6.5  ? Smokeless tobacco: Never  ?Vaping Use  ? Vaping Use: Never used  ?Substance and Sexual Activity  ? Alcohol use: Yes  ?  Comment: couple beers per day  ? Drug use: No  ? Sexual activity: Yes  ?Other Topics Concern  ? Not on file  ?Social History Narrative  ? Not on file  ? ?Social Determinants of Health  ? ?Financial Resource Strain: Not on file  ?Food Insecurity: Not on file  ?Transportation Needs: Not on file  ?Physical Activity: Not on file  ?Stress: Not on  file  ?Social Connections: Not on file  ?Intimate Partner Violence: Not on file  ? ? ?Family History  ?Problem Relation Age of Onset  ? Sarcoidosis Father   ? ? ?Current Outpatient Medications  ?Medication Sig Dispense Refill  ? aspirin EC 81 MG tablet Take 81 mg by mouth daily.    ? atorvastatin (LIPITOR) 10 MG tablet Take 10 mg by mouth daily.    ? carvedilol (COREG) 6.25 MG tablet Take 6.25 mg by mouth 2 (two) times daily.  0  ? furosemide (LASIX) 40 MG tablet Take 40 mg by mouth daily.  0  ? gabapentin (NEURONTIN) 300 MG capsule Take 1 capsule (300 mg total) by mouth 3 (three) times daily. 90 capsule 0  ? HYDROXYZINE PAMOATE PO Take 25 mg by mouth.    ? meloxicam (MOBIC) 7.5 MG tablet Take 7.5 mg by mouth daily.    ? trazodone (DESYREL) 300 MG tablet Take 300 mg by mouth at bedtime.    ?  losartan (COZAAR) 25 MG tablet Take 1 tablet (25 mg total) by mouth daily. 30 tablet 0  ? zolpidem (AMBIEN) 10 MG tablet Take 1 tablet (10 mg total) by mouth at bedtime as needed for sleep. 30 tablet 3  ? ?No current facility-administered medications for this visit.  ? ? ?No Known Allergies ? ?REVIEW OF SYSTEMS (negative unless checked):  ? ?Cardiac:  ?'[]'$  Chest pain or chest pressure? ?'[]'$  Shortness of breath upon activity? ?'[]'$  Shortness of breath when lying flat? ?'[]'$  Irregular heart rhythm? ? ?Vascular:  ?'[]'$  Pain in calf, thigh, or hip brought on by walking? ?'[]'$  Pain in feet at night that wakes you up from your sleep? ?'[]'$  Blood clot in your veins? ?'[x]'$  Leg swelling? ? ?Pulmonary:  ?'[]'$  Oxygen at home? ?'[]'$  Productive cough? ?'[]'$  Wheezing? ? ?Neurologic:  ?'[]'$  Sudden weakness in arms or legs? ?'[]'$  Sudden numbness in arms or legs? ?'[]'$  Sudden onset of difficult speaking or slurred speech? ?'[]'$  Temporary loss of vision in one eye? ?'[]'$  Problems with dizziness? ? ?Gastrointestinal:  ?'[]'$  Blood in stool? ?'[]'$  Vomited blood? ? ?Genitourinary:  ?'[]'$  Burning when urinating? ?'[]'$  Blood in urine? ? ?Psychiatric:  ?'[]'$  Major depression ? ?Hematologic:  ?'[]'$  Bleeding problems? ?'[]'$  Problems with blood clotting? ? ?Dermatologic:  ?'[]'$  Rashes or ulcers? ? ?Constitutional:  ?'[]'$  Fever or chills? ? ?Ear/Nose/Throat:  ?'[]'$  Change in hearing? ?'[]'$  Nose bleeds? ?'[]'$  Sore throat? ? ?Musculoskeletal:  ?'[]'$  Back pain? ?'[]'$  Joint pain? ?'[]'$  Muscle pain? ? ? ?Physical Examination  ?  ? ?Vitals:  ? 05/23/21 1259  ?BP: 140/90  ?Pulse: 100  ?Resp: 18  ?Temp: 98 ?F (36.7 ?C)  ?TempSrc: Temporal  ?SpO2: 97%  ?Weight: (!) 398 lb (180.5 kg)  ?Height: 6' (1.829 m)  ? ?Body mass index is 53.98 kg/m?. ? ?General:  WDWN in NAD; vital signs documented above ?Gait: Normal ?HENT: WNL, normocephalic ?Pulmonary: normal non-labored breathing , without wheezing ?Cardiac: regular HR, without  Murmurs without carotid bruit ?Abdomen: obese, soft ?Vascular Exam/Pulses:2+ radial  pulses, 2+ DP pulses bilaterally. Feet warm and well perfused ?Extremities: without varicose veins, without reticular veins, with edema of distal legs and dorsum of both feet, with stasis pigmentation, without lipodermatosclerosis, without ulcers ?Musculoskeletal: no muscle wasting or atrophy  ?Neurologic: A&O X 3;  No focal weakness or paresthesias are detected ?Psychiatric:  The pt has Normal affect. ? ?Non-invasive Vascular Imaging  ? ?BLE Venous Insufficiency Duplex (05/23/21):  ?RLE:  ?No DVT and SVT ?GSV reflux SFJ through proximal  calf ?GSV diameter 0.46- 0.64 ?No SSV reflux  ?CFV, FV deep venous reflux ? ? ?Medical Decision Making  ? ?Shaun Stokes is a 56 y.o. male who presents with: RLE chronic venous insufficiency with swelling. He does have hyperpigmentation and lipodermatosclerosis starting on his legs bilaterally. His duplex today shows extensive deep and superficial venous reflux. He has no DVT or SVT. No SSV reflux. He has reflux throughout GSV. His GSV is large enough to be considered for ablation. However, due to patients body habitus and weight he would not be a good candidate for ablation at this time. I think likely his swelling/ edema in his legs is multifactorial. ?Based on the patient's history and examination, I recommend: daily elevation above the level of his heart, exercise, weight reduction, refraining from prolonged sitting or standing. ?Patient is too large for either knee high or thigh high compression. Discussed possible option to wrap legs with ACE bandages but he lives along with no one to assist him. ?I really discussed importance of weight management and reduction. If he can lose some weight he could be candidate in the future for an ablation ?I think he could benefit from lymphatic pumps. For this reason I will bring him back in about 8 weeks for follow up ? ?Karoline Caldwell, PA-C ?[facility] ?Office: [phone] ? ?05/23/2021, 2:24 PM ? ?Clinic MD:  Dr. Carlis Abbott ?

## 2021-05-22 NOTE — Progress Notes (Signed)
? ?      ?Shaun Stokes    [id number]    02/02/1966 ? ?Primary Care Physician:Osei-Bonsu, Iona Beard, MD ? ?Referring Physician: Benito Mccreedy, MD ?[facility] ?[place],  [place] [address] ? ?Chief complaint:   ?Patient in for evaluation for obstructive sleep apnea ? ?HPI: ? ?Diagnosed with obstructive sleep apnea March 2016 ?His machine stopped working about 2 years ago ? ?History of snoring, witnessed apneas, insomnia ? ?Currently using trazodone and hydroxyzine, was prescribed Ambien which she was concerned about-has not been using Ambien ? ?Previous sleep study from 2016 did reveal severe obstructive sleep apnea with severe oxygen desaturations with oxygen nadir of 61% ? ?He did tolerate CPAP well with no significant problems ?Has not been using any machine ? ?Compliance report previously did reveal good compliance and limited number of hours of sleep ? ?He has a history of hypertension, history of heart failure, history of hypercholesterolemia ? ?Reformed smoker, quit about 7 years ago ? ?Has always had problems sleeping ? ?Outpatient Encounter Medications as of 05/22/2021  ?Medication Sig  ? aspirin EC 81 MG tablet Take 81 mg by mouth daily.  ? atorvastatin (LIPITOR) 10 MG tablet Take 10 mg by mouth daily.  ? carvedilol (COREG) 6.25 MG tablet Take 6.25 mg by mouth 2 (two) times daily.  ? furosemide (LASIX) 40 MG tablet Take 40 mg by mouth daily.  ? gabapentin (NEURONTIN) 300 MG capsule Take 1 capsule (300 mg total) by mouth 3 (three) times daily.  ? HYDROXYZINE PAMOATE PO Take 25 mg by mouth.  ? losartan (COZAAR) 25 MG tablet Take 1 tablet (25 mg total) by mouth daily.  ? meloxicam (MOBIC) 7.5 MG tablet Take 7.5 mg by mouth daily.  ? trazodone (DESYREL) 300 MG tablet Take 300 mg by mouth at bedtime.  ? zolpidem (AMBIEN) 10 MG tablet Take 1 tablet (10 mg total) by mouth at bedtime as needed for sleep.  ? ?No facility-administered encounter medications on file as of 05/22/2021.  ? ? ?Allergies as of  05/22/2021  ? (No Known Allergies)  ? ? ?Past Medical History:  ?Diagnosis Date  ? Alcohol abuse   ? Alcohol abuse   ? Anxiety   ? Chest pain   ? Depression   ? Diabetes ([place])   ? Diabetic polyneuropathy ([place])   ? Dizziness   ? r/t anxiety, alcohol withdrawal  ? History of tobacco abuse   ? Hyperlipemia   ? Hypertension   ? Insomnia   ? Normocytic anemia   ? Obesity   ? OSA (obstructive sleep apnea) 08/24/2014  ? Prediabetes   ? Vitamin D deficiency   ? ? ?Past Surgical History:  ?Procedure Laterality Date  ? COLONOSCOPY WITH PROPOFOL N/A 09/16/2015  ? Procedure: COLONOSCOPY WITH PROPOFOL;  Surgeon: Carol Ada, MD;  [facility];  Service: Endoscopy;  Laterality: N/A;  ? COLONOSCOPY WITH PROPOFOL N/A 05/13/2020  ? Procedure: COLONOSCOPY WITH PROPOFOL;  Surgeon: Carol Ada, MD;  [facility];  Service: Endoscopy;  Laterality: N/A;  ? NO PAST SURGERIES    ? ? ?Family History  ?Problem Relation Age of Onset  ? Sarcoidosis Father   ? ? ?Social History  ? ?Socioeconomic History  ? Marital status: Single  ?  Spouse name: Not on file  ? Number of children: Not on file  ? Years of education: Not on file  ? Highest education level: Not on file  ?Occupational History  ? Occupation: Housekeeping  ?Tobacco Use  ?  Smoking status: Former  ?  Packs/day: 1.00  ?  Years: 32.00  ?  Pack years: 32.00  ?  Types: Cigarettes  ?  Quit date: 11/18/2014  ?  Years since quitting: 6.5  ? Smokeless tobacco: Never  ?Vaping Use  ? Vaping Use: Never used  ?Substance and Sexual Activity  ? Alcohol use: Yes  ?  Comment: couple beers per day  ? Drug use: No  ? Sexual activity: Yes  ?Other Topics Concern  ? Not on file  ?Social History Narrative  ? Not on file  ? ?Social Determinants of Health  ? ?Financial Resource Strain: Not on file  ?Food Insecurity: Not on file  ?Transportation Needs: Not on file  ?Physical Activity: Not on file  ?Stress: Not on file  ?Social Connections: Not on file  ?Intimate Partner Violence: Not on file   ? ? ?Review of Systems  ?Constitutional:  Positive for fatigue.  ?Respiratory:  Negative for shortness of breath.   ?Psychiatric/Behavioral:  Positive for sleep disturbance.   ? ?Vitals:  ? 05/22/21 1148  ?BP: 128/84  ?Pulse: 84  ?Temp: 97.9 ?F (36.6 ?C)  ?SpO2: 100%  ? ? ? ?Physical Exam ?Constitutional:   ?   Appearance: He is obese.  ?HENT:  ?   Head: Normocephalic.  ?   Mouth/Throat:  ?   Mouth: Mucous membranes are moist.  ?   Comments: Mallampati 4, crowded oropharynx, macroglossia ?Cardiovascular:  ?   Rate and Rhythm: Normal rate and regular rhythm.  ?   Heart sounds: No murmur heard. ?  No friction rub.  ?Pulmonary:  ?   Effort: No respiratory distress.  ?   Breath sounds: No stridor. No wheezing or rhonchi.  ?Musculoskeletal:     ?   General: Swelling present.  ?   Cervical back: No rigidity or tenderness.  ?   Right lower leg: Edema present.  ?   Left lower leg: Edema present.  ?Neurological:  ?   Mental Status: He is alert.  ?Psychiatric:     ?   Mood and Affect: Mood normal.  ? ? ?  02/21/2021  ?  2:00 PM  ?Results of the Epworth flowsheet  ?Sitting and reading 1  ?Watching TV 1  ?Sitting, inactive in a public place (e.g. a theatre or a meeting) 0  ?As a passenger in a car for an hour without a break 2  ?Lying down to rest in the afternoon when circumstances permit 1  ?Sitting and talking to someone 1  ?Sitting quietly after a lunch without alcohol 0  ?In a car, while stopped for a few minutes in traffic 0  ?Total score 6  ? ? ? ?Data Reviewed: ?Previous sleep study reviewed showing severe obstructive sleep apnea with severe oxygen desaturations ? ?Assessment:  ?History of obstructive sleep apnea with dysfunctional machine ?Has not been using CPAP for the last couple of years ? ?He does have a background history of heart failure-diastolic heart failure ? ?Sleep onset and sleep maintenance insomnia ? ?Morbid obesity ? ?Daytime fatigue ?Plan/Recommendations: ?We will schedule the patient for split-night  study ? ?He will continue his current medications for insomnia-does use trazodone 300 mg ? ?Encouraged regular exercise and weight loss efforts ? ?Encouraged to call with any significant concerns ? ?An in lab study is appropriate because of the severity of sleep disordered breathing previously, previous documentation of nocturnal hypoxemia and history of heart failure ? ?Tentative follow-up in 3 months ? ? ?Ligia Duguay  Ander Slade MD ?[facility] ?05/22/2021, 12:24 PM ? ?CC: Benito Mccreedy, MD ? ? ?

## 2021-05-22 NOTE — Patient Instructions (Signed)
Schedule for in lab split-night study ? ?Previous home sleep study did reveal severe obstructive sleep apnea with severe oxygen desaturations ? ?Follow-up in 3 months ? ?Call with significant concerns ?

## 2021-05-23 ENCOUNTER — Ambulatory Visit: Payer: Managed Care, Other (non HMO) | Admitting: Physician Assistant

## 2021-05-23 ENCOUNTER — Ambulatory Visit (HOSPITAL_COMMUNITY)
Admission: RE | Admit: 2021-05-23 | Discharge: 2021-05-23 | Disposition: A | Discharge status: Home / Self Care | Payer: Managed Care, Other (non HMO) | Source: Ambulatory Visit | Attending: Physician Assistant | Admitting: Physician Assistant

## 2021-05-23 VITALS — BP 140/90 | HR 100 | Temp 98.0°F | Resp 18 | Ht 72.0 in | Wt 398.0 lb

## 2021-05-23 DIAGNOSIS — I872 Venous insufficiency (chronic) (peripheral): Secondary | ICD-10-CM | POA: Diagnosis present

## 2021-05-23 DIAGNOSIS — I890 Lymphedema, not elsewhere classified: Secondary | ICD-10-CM | POA: Diagnosis not present

## 2021-07-12 NOTE — Progress Notes (Unsigned)
Office Note     CC:  follow up Requesting Provider:  Benito Mccreedy, MD  HPI: Shaun Stokes is a 56 y.o. (April 13, 1965) male who presents for follow up for venous insufficiency. He initially presented in April of this year for evaluation of bilateral lower extremity swelling R> L. He reported swelling for several years that had increased over the past 6 months. He says he does have CHF. Was not having any pain in in his legs, aching or throbbing. He did report heaviness as well as intermittent episodes of "water blisters" that would appear when swelling was more significant. 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 Otherwise he does not really exercise. His duplex at his last visit showed superficial venous reflux and his veins were of adequate size for ablation however secondary to his body habitus he was not considered candidate for ablation. He unfortunately also was not able to obtain compression socks because they do not make his size. I had a lengthy discussion about weight loss and exercise as well as daily leg elevation. He returns today for follow up to see if he has had any improvement with conservative therapy.    He reports ****  Past Medical History:  Diagnosis Date   Alcohol abuse    Alcohol abuse    Anxiety    Chest pain    Depression    Diabetes ([place])    Diabetic polyneuropathy ([place])    Dizziness    r/t anxiety, alcohol withdrawal   History of tobacco abuse    Hyperlipemia    Hypertension    Insomnia    Normocytic anemia    Obesity    OSA (obstructive sleep apnea) 08/24/2014   Prediabetes    Vitamin D deficiency     Past Surgical History:  Procedure Laterality Date   COLONOSCOPY WITH PROPOFOL N/A 09/16/2015   Procedure: COLONOSCOPY WITH PROPOFOL;  Surgeon: Carol Ada, MD;  [facility];  Service: Endoscopy;  Laterality: N/A;   COLONOSCOPY WITH PROPOFOL N/A 05/13/2020   Procedure: COLONOSCOPY WITH  PROPOFOL;  Surgeon: Carol Ada, MD;  [facility];  Service: Endoscopy;  Laterality: N/A;   NO PAST SURGERIES      Social History   Socioeconomic History   Marital status: Single    Spouse name: Not on file   Number of children: Not on file   Years of education: Not on file   Highest education level: Not on file  Occupational History   Occupation: Housekeeping  Tobacco Use   Smoking status: Former    Packs/day: 1.00    Years: 32.00    Pack years: 32.00    Types: Cigarettes    Quit date: 11/18/2014    Years since quitting: 6.6   Smokeless tobacco: Never  Vaping Use   Vaping Use: Never used  Substance and Sexual Activity   Alcohol use: Yes    Comment: couple beers per day   Drug use: No   Sexual activity: Yes  Other Topics Concern   Not on file  Social History Narrative   Not on file   Social Determinants of Health   Financial Resource Strain: Not on file  Food Insecurity: Not on file  Transportation Needs: Not on file  Physical Activity: Not on file  Stress: Not on file  Social Connections: Not on file  Intimate Partner Violence: Not on file   *** Family History  Problem Relation  Age of Onset   Sarcoidosis Father     Current Outpatient Medications  Medication Sig Dispense Refill   aspirin EC 81 MG tablet Take 81 mg by mouth daily.     atorvastatin (LIPITOR) 10 MG tablet Take 10 mg by mouth daily.     carvedilol (COREG) 6.25 MG tablet Take 6.25 mg by mouth 2 (two) times daily.  0   furosemide (LASIX) 40 MG tablet Take 40 mg by mouth daily.  0   gabapentin (NEURONTIN) 300 MG capsule Take 1 capsule (300 mg total) by mouth 3 (three) times daily. 90 capsule 0   HYDROXYZINE PAMOATE PO Take 25 mg by mouth.     losartan (COZAAR) 25 MG tablet Take 1 tablet (25 mg total) by mouth daily. 30 tablet 0   meloxicam (MOBIC) 7.5 MG tablet Take 7.5 mg by mouth daily.     trazodone (DESYREL) 300 MG tablet Take 300 mg by mouth at bedtime.     zolpidem (AMBIEN) 10  MG tablet Take 1 tablet (10 mg total) by mouth at bedtime as needed for sleep. 30 tablet 3   No current facility-administered medications for this visit.    No Known Allergies   REVIEW OF SYSTEMS:  *** '[X]'$  denotes positive finding, '[ ]'$  denotes negative finding Cardiac  Comments:  Chest pain or chest pressure:    Shortness of breath upon exertion:    Short of breath when lying flat:    Irregular heart rhythm:        Vascular    Pain in calf, thigh, or hip brought on by ambulation:    Pain in feet at night that wakes you up from your sleep:     Blood clot in your veins:    Leg swelling:         Pulmonary    Oxygen at home:    Productive cough:     Wheezing:         Neurologic    Sudden weakness in arms or legs:     Sudden numbness in arms or legs:     Sudden onset of difficulty speaking or slurred speech:    Temporary loss of vision in one eye:     Problems with dizziness:         Gastrointestinal    Blood in stool:     Vomited blood:         Genitourinary    Burning when urinating:     Blood in urine:        Psychiatric    Major depression:         Hematologic    Bleeding problems:    Problems with blood clotting too easily:        Skin    Rashes or ulcers:        Constitutional    Fever or chills:      PHYSICAL EXAMINATION:  There were no vitals filed for this visit.  General:  WDWN in NAD; vital signs documented above Gait: Not observed HENT: WNL, normocephalic Pulmonary: normal non-labored breathing , without Rales, rhonchi,  wheezing Cardiac: {Desc; regular/irreg:14544} HR, without  Murmurs {With/Without:20273} carotid bruit*** Abdomen: soft, NT, no masses Skin: {With/Without:20273} rashes Vascular Exam/Pulses:  Right Left  Radial {Exam; arterial pulse strength 0-4:30167} {Exam; arterial pulse strength 0-4:30167}  Ulnar {Exam; arterial pulse strength 0-4:30167} {Exam; arterial pulse strength 0-4:30167}  Femoral {Exam; arterial pulse strength  0-4:30167} {Exam; arterial pulse strength 0-4:30167}  Popliteal {Exam; arterial pulse  strength 0-4:30167} {Exam; arterial pulse strength 0-4:30167}  DP {Exam; arterial pulse strength 0-4:30167} {Exam; arterial pulse strength 0-4:30167}  PT {Exam; arterial pulse strength 0-4:30167} {Exam; arterial pulse strength 0-4:30167}   Extremities: {With/Without:20273} ischemic changes, {With/Without:20273} Gangrene , {With/Without:20273} cellulitis; {With/Without:20273} open wounds;  Musculoskeletal: no muscle wasting or atrophy  Neurologic: A&O X 3;  No focal weakness or paresthesias are detected Psychiatric:  The pt has {Desc; normal/abnormal:11317::"Normal"} affect.   Non-Invasive Vascular Imaging:   ***    ASSESSMENT/PLAN:: 56 y.o. male here for follow up for venous insufficiency   -***   Karoline Caldwell, PA-C [facility] MD:   Roxanne Mins

## 2021-07-18 ENCOUNTER — Ambulatory Visit: Payer: Managed Care, Other (non HMO)

## 2021-07-18 DIAGNOSIS — I890 Lymphedema, not elsewhere classified: Secondary | ICD-10-CM

## 2021-07-18 DIAGNOSIS — I872 Venous insufficiency (chronic) (peripheral): Secondary | ICD-10-CM

## 2021-07-19 ENCOUNTER — Ambulatory Visit (INDEPENDENT_AMBULATORY_CARE_PROVIDER_SITE_OTHER): Payer: Commercial Managed Care - HMO | Admitting: Physician Assistant

## 2021-07-19 VITALS — BP 119/80 | HR 95 | Temp 97.8°F | Resp 16 | Ht 72.0 in | Wt >= 6400 oz

## 2021-07-19 DIAGNOSIS — I872 Venous insufficiency (chronic) (peripheral): Secondary | ICD-10-CM

## 2021-07-19 NOTE — Progress Notes (Signed)
POST OPERATIVE OFFICE NOTE    CC:  F/u for surgery  HPI:  This is a 56 y.o. male who is here for f/u to discuss plan for venous insufficiency with B LE chronic edema.  He was seen in our office for venous insufficieny.    He initially presented in April of this year for evaluation of bilateral lower extremity swelling R> L.  He denies wounds or weeping from his skin.   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  He has had an injury on the job prior and states he gained 80 lbs over the last 6 months.   He did get a pair of knee high compression, but they do not fit well because of his size.  He is here to try and determine a plan to work towards loosing weight and getting his veins taken care of.    No Known Allergies  Current Outpatient Medications  Medication Sig Dispense Refill   aspirin EC 81 MG tablet Take 81 mg by mouth daily.     atorvastatin (LIPITOR) 10 MG tablet Take 10 mg by mouth daily.     carvedilol (COREG) 6.25 MG tablet Take 6.25 mg by mouth 2 (two) times daily.  0   furosemide (LASIX) 40 MG tablet Take 40 mg by mouth daily.  0   gabapentin (NEURONTIN) 300 MG capsule Take 1 capsule (300 mg total) by mouth 3 (three) times daily. 90 capsule 0   HYDROXYZINE PAMOATE PO Take 25 mg by mouth.     losartan (COZAAR) 25 MG tablet Take 1 tablet (25 mg total) by mouth daily. 30 tablet 0   meloxicam (MOBIC) 7.5 MG tablet Take 7.5 mg by mouth daily.     trazodone (DESYREL) 300 MG tablet Take 300 mg by mouth at bedtime.     zolpidem (AMBIEN) 10 MG tablet Take 1 tablet (10 mg total) by mouth at bedtime as needed for sleep. 30 tablet 3   No current facility-administered medications for this visit.     ROS:  See HPI  Physical Exam:  obese General no acute distress Lungs non labored breathing Independent ambulation  B LE edema without ischemic skin changes, no open wounds or weeping Palpable pulses B LE  Venous Reflux Times   +--------------+---------+------+-----------+------------+--------+  RIGHT         Reflux NoRefluxReflux TimeDiameter cmsComments                          Yes                                   +--------------+---------+------+-----------+------------+--------+  CFV                     yes   >1 second                       +--------------+---------+------+-----------+------------+--------+  FV mid                  yes   >1 second                       +--------------+---------+------+-----------+------------+--------+  Popliteal     no                                              +--------------+---------+------+-----------+------------+--------+  GSV at [facility]              yes    >500 ms      0.97              +--------------+---------+------+-----------+------------+--------+  GSV prox thigh          yes    >500 ms      0.56              +--------------+---------+------+-----------+------------+--------+  GSV mid thigh           yes    >500 ms      0.64              +--------------+---------+------+-----------+------------+--------+  GSV dist thigh          yes    >500 ms      0.53              +--------------+---------+------+-----------+------------+--------+  GSV at knee             yes    >500 ms      0.51              +--------------+---------+------+-----------+------------+--------+  GSV prox calf           yes    >500 ms      0.56              +--------------+---------+------+-----------+------------+--------+  GSV mid calf  no                            0.46              +--------------+---------+------+-----------+------------+--------+  SSV Pop Fossa no                            0.45              +--------------+---------+------+-----------+------------+--------+  SSV prox calf no                            0.43               +--------------+---------+------+-----------+------------+--------+  SSV mid calf  no                            0.51              +--------------+---------+------+-----------+------------+--------+  AASV p        no                            0.46              +--------------+---------+------+-----------+------------+--------+       Summary:  Right:  - No evidence of superficial venous reflux seen in the right short  saphenous vein.  - Venous reflux is noted in the right common femoral vein.  - Venous reflux is noted in the right sapheno-femoral junction.  - Venous reflux is noted in the right greater saphenous vein in the thigh.  - Venous reflux is noted in the right greater saphenous vein in the  proximal calf.  - Venous reflux is noted in the right femoral vein.         Assessment/Plan:  This is a 56 y.o. male who is Venous insufficieny in both the  deep and superficial GSV starting at the junction.  He is unable to fit into proper compression and his body habitus is the limiting factor to move forward with intervention such as laser ablation.  We discussed the best plan would be  elevation when at rest, starting a progressive walking program and if he has access to a body of water to help with compression.  Until he looses weight there is no intervention that will help with his edema.  He will f/u PRN.       Roxy Horseman PA-C [facility] [phone]   Clinic MD:  Donzetta Matters

## 2021-07-20 ENCOUNTER — Encounter: Payer: Self-pay | Admitting: Physician Assistant

## 2021-08-21 ENCOUNTER — Telehealth: Payer: Self-pay | Admitting: Pulmonary Disease

## 2021-08-25 NOTE — Telephone Encounter (Signed)
Sleep Study Denied Received: 1 month ago Cyndi Bender, Janice Norrie, MD Sleep Study denied! The request was denied because patient has not had an APAP or automatic airway pressure must have been initiated within the last 30 days with one of the following. AHI or apnea-hypopnea index is five or more events per hour with continued symptoms. Persistent despite use of APAP for an average of at least 4 hours each night for at least 70 percent of the night during trial. You can appeal this by sending a letter to [place] (fax) [phone] or request a review by phone by calling [phone].Marland KitchenMarland KitchenReference Case#[id number].

## 2021-08-28 ENCOUNTER — Ambulatory Visit: Payer: Managed Care, Other (non HMO) | Admitting: Pulmonary Disease

## 2021-09-27 NOTE — Telephone Encounter (Signed)
Still waiting on response from Dr. Jenetta Downer. Please advise.

## 2022-03-15 DEATH — deceased
